# Patient Record
Sex: Female | Born: 1985 | Race: White | Marital: Married | Smoking: Never smoker
Health system: Southern US, Community
[De-identification: ages and names within clinical notes are randomized; demographics above are authoritative.]

## PROBLEM LIST (undated history)

## (undated) DIAGNOSIS — J020 Streptococcal pharyngitis: Secondary | ICD-10-CM

## (undated) DIAGNOSIS — R51 Headache: Secondary | ICD-10-CM

## (undated) DIAGNOSIS — J40 Bronchitis, not specified as acute or chronic: Secondary | ICD-10-CM

## (undated) DIAGNOSIS — K219 Gastro-esophageal reflux disease without esophagitis: Secondary | ICD-10-CM

## (undated) DIAGNOSIS — Z789 Other specified health status: Secondary | ICD-10-CM

## (undated) HISTORY — PX: NO PAST SURGERIES: SHX2092

## (undated) HISTORY — DX: Headache: R51

## (undated) HISTORY — DX: Gastro-esophageal reflux disease without esophagitis: K21.9

---

## 2010-10-13 LAB — OB RESULTS CONSOLE HIV ANTIBODY (ROUTINE TESTING): HIV: NONREACTIVE

## 2010-10-13 LAB — OB RESULTS CONSOLE RPR: RPR: NONREACTIVE

## 2010-10-13 LAB — OB RESULTS CONSOLE ABO/RH: RH Type: POSITIVE

## 2010-10-30 ENCOUNTER — Other Ambulatory Visit (HOSPITAL_COMMUNITY)
Admission: RE | Admit: 2010-10-30 | Discharge: 2010-10-30 | Disposition: A | Discharge status: Home / Self Care | Payer: 59 | Source: Ambulatory Visit | Attending: Obstetrics and Gynecology | Admitting: Obstetrics and Gynecology

## 2010-10-30 DIAGNOSIS — Z113 Encounter for screening for infections with a predominantly sexual mode of transmission: Secondary | ICD-10-CM | POA: Insufficient documentation

## 2010-10-30 DIAGNOSIS — Z01419 Encounter for gynecological examination (general) (routine) without abnormal findings: Secondary | ICD-10-CM | POA: Insufficient documentation

## 2010-10-30 LAB — OB RESULTS CONSOLE GC/CHLAMYDIA
Chlamydia: NEGATIVE
Gonorrhea: NEGATIVE

## 2010-12-20 ENCOUNTER — Inpatient Hospital Stay (HOSPITAL_COMMUNITY): Payer: 59

## 2010-12-20 ENCOUNTER — Encounter (HOSPITAL_COMMUNITY): Payer: Self-pay

## 2010-12-20 ENCOUNTER — Emergency Department (HOSPITAL_COMMUNITY)
Admission: AD | Admit: 2010-12-20 | Discharge: 2010-12-20 | Disposition: A | Discharge status: Home / Self Care | Payer: 59 | Source: Ambulatory Visit | Attending: Emergency Medicine | Admitting: Emergency Medicine

## 2010-12-20 DIAGNOSIS — O2690 Pregnancy related conditions, unspecified, unspecified trimester: Secondary | ICD-10-CM | POA: Insufficient documentation

## 2010-12-20 DIAGNOSIS — R0602 Shortness of breath: Secondary | ICD-10-CM

## 2010-12-20 DIAGNOSIS — Z3400 Encounter for supervision of normal first pregnancy, unspecified trimester: Secondary | ICD-10-CM

## 2010-12-20 DIAGNOSIS — R05 Cough: Secondary | ICD-10-CM

## 2010-12-20 DIAGNOSIS — R059 Cough, unspecified: Secondary | ICD-10-CM | POA: Insufficient documentation

## 2010-12-20 DIAGNOSIS — Z79899 Other long term (current) drug therapy: Secondary | ICD-10-CM | POA: Insufficient documentation

## 2010-12-20 HISTORY — DX: Other specified health status: Z78.9

## 2010-12-20 LAB — CBC
HCT: 29.9 % — ABNORMAL LOW (ref 36.0–46.0)
Hemoglobin: 10.4 g/dL — ABNORMAL LOW (ref 12.0–15.0)
MCHC: 34.8 g/dL (ref 30.0–36.0)
MCV: 86.7 fL (ref 78.0–100.0)
RDW: 12.9 % (ref 11.5–15.5)

## 2010-12-20 LAB — DIFFERENTIAL
Basophils Absolute: 0 10*3/uL (ref 0.0–0.1)
Basophils Relative: 0 % (ref 0–1)
Eosinophils Relative: 4 % (ref 0–5)
Lymphocytes Relative: 26 % (ref 12–46)
Monocytes Absolute: 0.4 10*3/uL (ref 0.1–1.0)
Monocytes Relative: 6 % (ref 3–12)

## 2010-12-20 LAB — COMPREHENSIVE METABOLIC PANEL
AST: 15 U/L (ref 0–37)
BUN: 4 mg/dL — ABNORMAL LOW (ref 6–23)
CO2: 22 mEq/L (ref 19–32)
Calcium: 8.9 mg/dL (ref 8.4–10.5)
Creatinine, Ser: 0.52 mg/dL (ref 0.50–1.10)
GFR calc non Af Amer: >90 mL/min (ref >90)
Total Bilirubin: 0.3 mg/dL (ref 0.3–1.2)

## 2010-12-20 MED ORDER — ALBUTEROL SULFATE (5 MG/ML) 0.5% IN NEBU
2.5000 mg | INHALATION_SOLUTION | RESPIRATORY_TRACT | Status: DC
  Administered 2010-12-20: 2.5 mg via RESPIRATORY_TRACT

## 2010-12-20 MED ORDER — AMOXICILLIN 500 MG PO CAPS: 500.0000 mg | ORAL_CAPSULE | Freq: Three times a day (TID) | ORAL | Status: AC

## 2010-12-20 MED ORDER — ALBUTEROL SULFATE (5 MG/ML) 0.5% IN NEBU
INHALATION_SOLUTION | RESPIRATORY_TRACT | Status: AC
  Administered 2010-12-20: 2.5 mg via RESPIRATORY_TRACT
  Filled 2010-12-20: qty 0.5

## 2010-12-20 MED ORDER — ALBUTEROL SULFATE HFA 108 (90 BASE) MCG/ACT IN AERS
2.0000 | INHALATION_SPRAY | RESPIRATORY_TRACT | Status: AC
  Filled 2010-12-20: qty 6.7

## 2010-12-20 MED ORDER — ALBUTEROL SULFATE (5 MG/ML) 0.5% IN NEBU
2.5000 mg | INHALATION_SOLUTION | RESPIRATORY_TRACT | Status: DC
  Administered 2010-12-20: 2.5 mg via RESPIRATORY_TRACT
  Filled 2010-12-20: qty 0.5

## 2010-12-20 MED ORDER — IPRATROPIUM BROMIDE 0.02 % IN SOLN
0.5000 mg | Freq: Once | RESPIRATORY_TRACT | Status: AC
  Administered 2010-12-20: 0.5 mg via RESPIRATORY_TRACT
  Filled 2010-12-20: qty 2.5

## 2010-12-20 MED ORDER — PREDNISONE 10 MG PO TABS: 20.0000 mg | ORAL_TABLET | Freq: Every day | ORAL | Status: AC

## 2010-12-20 NOTE — ED Provider Notes (Signed)
History     CSN: [id number] Arrival date & time: 12/20/2010  2:31 PM   First MD Initiated Contact with Patient 12/20/10 2114      Chief Complaint  Patient presents with  . Shortness of Breath    (Consider location/radiation/quality/duration/timing/severity/associated sxs/prior treatment) HPI Comments: Patient is pregnant, was seen at [facility] for cough for 3 weeks.  She was sent here for eval of possible pe.  Patient tells me that she feels much better after 2 nebs given there.  She denies calf or leg pain.  No fevers.  No chest pain.    Patient is a 25 y.o. female presenting with shortness of breath. The history is provided by the patient.  Shortness of Breath  The current episode started more than 2 weeks ago. The problem occurs continuously. The problem has been gradually worsening. The problem is moderate. The symptoms are relieved by beta-agonist inhalers. The symptoms are aggravated by nothing. Associated symptoms include cough and shortness of breath. Pertinent negatives include no chest pain, no chest pressure and no fever.    Past Medical History  Diagnosis Date  . No pertinent past medical history     Past Surgical History  Procedure Date  . No past surgeries     No family history on file.  History  Substance Use Topics  . Smoking status: Not on file  . Smokeless tobacco: Not on file  . Alcohol Use: Not on file    OB History    Grav Para Term Preterm Abortions TAB SAB Ect Mult Living   1               Review of Systems  Constitutional: Negative for fever.  Respiratory: Positive for cough and shortness of breath.   Cardiovascular: Negative for chest pain.  All other systems reviewed and are negative.    Allergies  Imitrex and Maxalt  Home Medications   Current Outpatient Rx  Name Route Sig Dispense Refill  . ACETAMINOPHEN 500 MG PO TABS Oral Take 1,000 mg by mouth every 6 (six) hours as needed. Patient is using this medication for pain.      . ALBUTEROL SULFATE HFA 108 (90 BASE) MCG/ACT IN AERS Inhalation Inhale 2 puffs into the lungs every 6 (six) hours as needed.      . DELSYM PO Oral Take 5 mLs by mouth daily as needed. Patient is using this medication for cough.     Marland Kitchen DIPHENHYDRAMINE HCL 25 MG PO TABS Oral Take 50 mg by mouth every 6 (six) hours as needed. Patient used this medication for itching.     Marland Kitchen HYDROCODONE-HOMATROPINE 5-1.5 MG/5ML PO SYRP Oral Take 5 mLs by mouth every 6 (six) hours as needed.      Marland Kitchen LORATADINE 10 MG PO TABS Oral Take 10 mg by mouth daily. Patient used this medication for allergies.     . ONDANSETRON 8 MG PO TBDP Oral Take 8 mg by mouth every 8 (eight) hours as needed.        BP 114/65  Pulse 92  Temp(Src) 98.4 F (36.9 C) (Oral)  Resp 16  Ht 5\' 7"  (1.702 m)  Wt 171 lb 3.2 oz (77.656 kg)  BMI 26.81 kg/m2  SpO2 97%  Physical Exam  Nursing note and vitals reviewed. Constitutional: She is oriented to person, place, and time. She appears well-developed and well-nourished. No distress.  HENT:  Head: Normocephalic and atraumatic.  Neck: Normal range of motion. Neck supple.  Cardiovascular: Normal  rate and regular rhythm.  Exam reveals no gallop and no friction rub.   No murmur heard. Pulmonary/Chest: Effort normal and breath sounds normal. No respiratory distress. She has no wheezes.  Abdominal: Soft. Bowel sounds are normal. She exhibits no distension. There is no tenderness.  Musculoskeletal: Normal range of motion.       No calf pain.  Homan's absent.  Neurological: She is alert and oriented to person, place, and time.  Skin: Skin is warm and dry. She is not diaphoretic.    ED Course  Procedures (including critical care time)  Labs Reviewed  CBC - Abnormal; Notable for the following:    RBC 3.45 (*)    Hemoglobin 10.4 (*)    HCT 29.9 (*)    All other components within normal limits  COMPREHENSIVE METABOLIC PANEL - Abnormal; Notable for the following:    BUN 4 (*)    Albumin 2.9  (*)    All other components within normal limits  DIFFERENTIAL   Dg Chest 2 View  12/20/2010  *RADIOLOGY REPORT*  Clinical Data: Hypoxemia with difficulty breathing and cough for 3 weeks.  [redacted] weeks pregnant.  CHEST - 2 VIEW  Comparison: None.  Findings: The heart size and mediastinal contours are normal.  The pulmonary vascularity is normal for gravid state.  The lungs are clear.  There is no pleural effusion or pneumothorax.  Osseous structures appear normal.  IMPRESSION: No active cardiopulmonary process.  Original Report Authenticated By: Gerrianne Scale, M.D.     1. Shortness of breath   2. Pregnancy, first normal   3. Cough       MDM  I evaluated the patient.  Her vital signs were stable.  There was no tachycardia, tachypnea, or hypoxia.  She does have a bronchitic cough.  I believe this to be bronchitis.  This seems to be bronchitis.  I highly doubt pe.  I will treat with antibiotics, prednisone, and inhalers.          Geoffery Lyons, MD 12/20/10 2144

## 2010-12-20 NOTE — Progress Notes (Signed)
Patient states she was treated for strep throat about 3 weeks ago, took all medication. Started having a cough about the same time. Cough is getting worse and at times feels unable to catch her breath when coughing. Having tightness in the chest that is worse with deep breathing.

## 2010-12-20 NOTE — ED Notes (Signed)
[facility] [redacted] weeks pregnant with c/o several weeks of cough. Pt states the cough has been progressively worsening, making it difficult to sleep at night. Pt states her chest hurts from coughing. Pt was given 2 breathing tx at [facility] with relief. Lungs clear.

## 2010-12-20 NOTE — Progress Notes (Signed)
Pt peak flow post Albuterol/Atrovent tx was 160. Pt's BBS are still very diminished with Wheezes throught. Pt is having a hard time speaking in full sentences due to SOB and Wheezing and has been this way for several days, per Pt's statement

## 2010-12-20 NOTE — Progress Notes (Signed)
Called to pt's room in MAU 9 to give 2.5 mg Albuterol/0.5 Atrovent HHN. Pre peak flow was 170, peak flow done per Dr request. Pt's BBS are very diminished with wheezes throught

## 2010-12-20 NOTE — ED Provider Notes (Signed)
History     Chief Complaint  Patient presents with  . Shortness of Breath   HPI  25 y/o G1 at 26 5/7 weeks presents with c/o SOB.  Pt was treated for Strep throat 3 weeks ago.  She states she felt better but she developed a chronic cough.  It was not relieved by Robitussin, Claritin and other OTC remedies.  She went back to urgent care and was told she just had a cough and was given a Codeine syrup, which did not help either. Pt reports cough is worsened when she lies flat, so she has been sleeping in a recliner or on several pillows.  On arrival today, she felt anxious because she could not get a breath in and it felt like someone was sitting on her chest.  Per NP, she was having difficulty completing her sentences.  Pt denies fevers/chills, diarrhea.  Pt has had vomiting but only after dry heaving after continuous coughing.  Pt denies lower extremity edema, palpitations, dizziness.  Pt denies contractions, vaginal bleeding.  She has not felt fetal movement yet.  Pt states she feels much better after 2 neb treatments.  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 No pertinent past medical history     Past Surgical History  Procedure Date  . No past surgeries     No family history on file.  History  Substance Use Topics  . Smoking status: Not on file  . Smokeless tobacco: Not on file  . Alcohol Use: Not on file    Allergies:  Allergies  Allergen Reactions  . Imitrex (Sumatriptan Base) Anaphylaxis  . Maxalt (Rizatriptan Benzoate) Anaphylaxis    Prescriptions prior to admission  Medication Sig Dispense Refill  . acetaminophen (TYLENOL) 500 MG tablet Take 1,000 mg by mouth every 6 (six) hours as needed. Patient is using this medication for pain.       Marland Kitchen albuterol (PROVENTIL HFA;VENTOLIN HFA) 108 (90 BASE) MCG/ACT inhaler Inhale 2 puffs into the lungs every 6 (six) hours as needed.        Marland Kitchen  Dextromethorphan Polistirex (DELSYM PO) Take 5 mLs by mouth daily as needed. Patient is using this medication for cough.       . diphenhydrAMINE (BENADRYL) 25 MG tablet Take 50 mg by mouth every 6 (six) hours as needed. Patient used this medication for itching.       Marland Kitchen HYDROcodone-homatropine (HYCODAN) 5-1.5 MG/5ML syrup Take 5 mLs by mouth every 6 (six) hours as needed.        . loratadine (CLARITIN) 10 MG tablet Take 10 mg by mouth daily. Patient used this medication for allergies.       Marland Kitchen ondansetron (ZOFRAN-ODT) 8 MG disintegrating tablet Take 8 mg by mouth every 8 (eight) hours as needed.        Marland Kitchen DISCONTD: dextromethorphan 15 MG/5ML syrup Take 5 mLs by mouth 4 (four) times daily as needed. Patient used this medication for a cold.         Review of Systems  Constitutional: Negative for fever and chills.       Decreased appetite.  HENT: Negative for congestion and sore throat.   Respiratory: Positive for cough and shortness of breath. Negative for wheezing.   Cardiovascular: Positive for chest pain and orthopnea. Negative for palpitations and leg swelling.  Gastrointestinal: Positive for vomiting. Negative for heartburn, nausea and  diarrhea.  Neurological: Negative for dizziness.   Physical Exam   Blood pressure 100/67, pulse 96, temperature 97.7 F (36.5 C), temperature source Oral, resp. rate 20, height 5\' 7"  (1.702 m), weight 77.656 kg (171 lb 3.2 oz), SpO2 99.00%. FHTs 140s  Physical Exam  Constitutional: She is oriented to person, place, and time. She appears well-developed and well-nourished. No distress.  HENT:  Head: Normocephalic and atraumatic.  Eyes: Conjunctivae and EOM are normal.  Neck: Normal range of motion.  Cardiovascular: Normal rate, regular rhythm and normal heart sounds.   No murmur heard. Respiratory: Effort normal. No respiratory distress. She has no wheezes. She has no rales.       Slightly decreased in bilateral bases.  Musculoskeletal: She exhibits no  edema and no tenderness.  Neurological: She is alert and oriented to person, place, and time.   Gen:  Sitting upright, Nasal cannula in place. HEENT:  Slight retraction with breathing. CV:  RRR Lungs with good air movement in upper lobes, less so at the bases.  No crackles, wheezes or rhonchi appreciated. Abdomen:  Soft, palpable uterus, NT No epigastric pain illicited. Extremities:  No edema, no calf tenderness.  Color normal.  MAU Course  Procedures    Assessment and Plan  Pregnancy at 16 5/7 weeks Acute SOB after Chronic cough- improved after 2 neb treatments. S/p Strep throat 3 weeks ago. Decreased O2 saturation. Unclear of etiology of SOB-Consider Bronchitis, Pulmonary Embolism.  Unlikely cardiomyopathy due to lack of LE edema, normal chest xray, but consider cardiac echo if clinically indicated. Spoke with Dr. Judd Lien, [facility], and he has accepted transfer for further evaluation.  CBC, CMP pending at the time of this note. I recommend proceeding with Spiral CT, if clinically indicated, due to low fetal risk.  Pt counseled on this. If discharged from the ER, pt already has an appointment to see me next week.   [name] 12/20/2010, 6:32 PM

## 2010-12-20 NOTE — Progress Notes (Signed)
Strep throat 3 wks  Ago. Treated with amoxicillin finished, sore throat ended and started a cough.pt states that she has a dry cough.

## 2010-12-20 NOTE — Progress Notes (Signed)
Dr. Ocie Bob on the unit to evaluate pt

## 2010-12-20 NOTE — Progress Notes (Signed)
[facility] as well [facility] called and informed of Dr. Ginnie Smart plan to transfer pt . Report given to both.

## 2010-12-20 NOTE — ED Provider Notes (Signed)
History     Chief Complaint  Patient presents with  . Shortness of Breath   HPI Ashley Mcguire 25 y.o. 16 w gestation.  Has had cough for 3 weeks.  Began after being treated for strep.  Unable to lie down to sleep at night.  Only sleeping for an hour or two at a time.  Having repetitive, nonproductive cough and difficulty breathing.  Took one nebulizer treatment at work a few days ago.  Denies having asthma and does not use an inhaler.     OB History    Grav Para Term Preterm Abortions TAB SAB Ect Mult Living   1               Past Medical History  Diagnosis Date  . No pertinent past medical history     Past Surgical History  Procedure Date  . No past surgeries     No family history on file.  History  Substance Use Topics  . Smoking status: Not on file  . Smokeless tobacco: Not on file  . Alcohol Use: Not on file    Allergies:  Allergies  Allergen Reactions  . Imitrex (Sumatriptan Base) Anaphylaxis  . Maxalt (Rizatriptan Benzoate) Anaphylaxis    Prescriptions prior to admission  Medication Sig Dispense Refill  . acetaminophen (TYLENOL) 500 MG tablet Take 1,000 mg by mouth every 6 (six) hours as needed. Patient is using this medication for pain.       Marland Kitchen albuterol (PROVENTIL HFA;VENTOLIN HFA) 108 (90 BASE) MCG/ACT inhaler Inhale 2 puffs into the lungs every 6 (six) hours as needed.        Marland Kitchen Dextromethorphan Polistirex (DELSYM PO) Take 5 mLs by mouth daily as needed. Patient is using this medication for cough.       . diphenhydrAMINE (BENADRYL) 25 MG tablet Take 50 mg by mouth every 6 (six) hours as needed. Patient used this medication for itching.       Marland Kitchen HYDROcodone-homatropine (HYCODAN) 5-1.5 MG/5ML syrup Take 5 mLs by mouth every 6 (six) hours as needed.        . loratadine (CLARITIN) 10 MG tablet Take 10 mg by mouth daily. Patient used this medication for allergies.       Marland Kitchen ondansetron (ZOFRAN-ODT) 8 MG disintegrating tablet Take 8 mg by mouth every 8 (eight) hours  as needed.        Marland Kitchen DISCONTD: dextromethorphan 15 MG/5ML syrup Take 5 mLs by mouth 4 (four) times daily as needed. Patient used this medication for a cold.         Review of Systems  Respiratory: Positive for cough.        Difficulty breathing   Physical Exam   Blood pressure 117/74, pulse 100, temperature 98.5 F (36.9 C), temperature source Oral, resp. rate 24, height 5\' 7"  (1.702 m), weight 171 lb 3.2 oz (77.656 kg), SpO2 94.00%.  Physical Exam  Nursing note and vitals reviewed. Constitutional: She is oriented to person, place, and time. She appears well-developed and well-nourished.  HENT:  Head: Normocephalic.  Eyes: EOM are normal.  Neck: Neck supple.  Cardiovascular: Normal rate.   Respiratory: She has wheezes.       Wheezes on right, repetitive,nonproductive cough, O2 sat 94%, peak flow 170  Musculoskeletal: Normal range of motion.  Neurological: She is alert and oriented to person, place, and time.  Skin: Skin is warm and dry.  Psychiatric: She has a normal mood and affect.    MAU Course  Procedures Respiratory therapy gave nebulizer treatment - peak flow 160 after treatment - still coughing, about 15 minutes after treatment, O2 sat 91-93% while breathing normally, with conscious deep breaths, O2 sat 94-96%, O2 at 5L via nasal cannula started. Dr. Dion Body notified of condition and treatments given so far.  Chest X ray ordered.  MDM Continues to have wheezing and coughing.  Second nebulizer treatment ordered.   Care assumed by Dr. Dion Body.     Ashley Mcguire 12/20/2010, 4:10 PM   Ashley Bernheim, NP 12/20/10 1755

## 2010-12-29 ENCOUNTER — Ambulatory Visit (HOSPITAL_COMMUNITY): Payer: 59 | Attending: Obstetrics and Gynecology

## 2010-12-29 ENCOUNTER — Other Ambulatory Visit: Payer: Self-pay | Admitting: Obstetrics and Gynecology

## 2010-12-29 DIAGNOSIS — Z3689 Encounter for other specified antenatal screening: Secondary | ICD-10-CM

## 2011-01-23 NOTE — L&D Delivery Note (Signed)
Delivery Note At 5:50 PM a viable female was delivered via Vaginal, Spontaneous Delivery (Presentation: Right Occiput Anterior).  APGAR: 9, 9; weight .   Placenta status: Intact, Spontaneous.  Cord: 3 vessels with the following complications: None.  Cord pH: n/a  Anesthesia: Epidural Local  Episiotomy: None Lacerations: 1st degree;Vaginal, left Suture Repair: 2.0 3.0 chromic Est. Blood Loss (mL): 350 10 cc 1% Lidocaine used I/O of bladder 225 ml Clot removed from cervical os manually  Mom to postpartum.  Baby to skin to skin.  [name] 06/11/2011, 6:19 PM

## 2011-05-19 LAB — OB RESULTS CONSOLE GBS: GBS: NEGATIVE

## 2011-06-01 ENCOUNTER — Inpatient Hospital Stay (HOSPITAL_COMMUNITY): Admission: AD | Admit: 2011-06-01 | Payer: Self-pay | Source: Ambulatory Visit | Admitting: Obstetrics and Gynecology

## 2011-06-05 ENCOUNTER — Telehealth (HOSPITAL_COMMUNITY): Payer: Self-pay | Admitting: *Deleted

## 2011-06-05 ENCOUNTER — Encounter (HOSPITAL_COMMUNITY): Payer: Self-pay | Admitting: *Deleted

## 2011-06-05 NOTE — Telephone Encounter (Signed)
Preadmission screen  

## 2011-06-08 ENCOUNTER — Other Ambulatory Visit: Payer: Self-pay | Admitting: Obstetrics and Gynecology

## 2011-06-10 ENCOUNTER — Encounter (HOSPITAL_COMMUNITY): Payer: Self-pay

## 2011-06-10 ENCOUNTER — Inpatient Hospital Stay (HOSPITAL_COMMUNITY)
Admission: RE | Admit: 2011-06-10 | Discharge: 2011-06-13 | DRG: 775 | Disposition: A | Discharge status: Home / Self Care | Payer: 59 | Source: Ambulatory Visit | Attending: Obstetrics and Gynecology | Admitting: Obstetrics and Gynecology

## 2011-06-10 DIAGNOSIS — O480 Post-term pregnancy: Principal | ICD-10-CM | POA: Diagnosis present

## 2011-06-10 HISTORY — DX: Bronchitis, not specified as acute or chronic: J40

## 2011-06-10 HISTORY — DX: Streptococcal pharyngitis: J02.0

## 2011-06-10 LAB — CBC
HCT: 32.8 % — ABNORMAL LOW (ref 36.0–46.0)
Hemoglobin: 11.1 g/dL — ABNORMAL LOW (ref 12.0–15.0)
MCH: 30.3 pg (ref 26.0–34.0)
MCHC: 33.8 g/dL (ref 30.0–36.0)
MCV: 89.6 fL (ref 78.0–100.0)

## 2011-06-10 MED ORDER — OXYTOCIN BOLUS FROM INFUSION
500.0000 mL | Freq: Once | INTRAVENOUS | Status: DC
  Filled 2011-06-10: qty 500

## 2011-06-10 MED ORDER — ZOLPIDEM TARTRATE 10 MG PO TABS
10.0000 mg | ORAL_TABLET | Freq: Every evening | ORAL | Status: DC | PRN
  Administered 2011-06-10: 10 mg via ORAL
  Filled 2011-06-10: qty 1

## 2011-06-10 MED ORDER — ONDANSETRON HCL 4 MG/2ML IJ SOLN
4.0000 mg | Freq: Four times a day (QID) | INTRAMUSCULAR | Status: DC | PRN
  Administered 2011-06-11: 4 mg via INTRAVENOUS
  Filled 2011-06-10: qty 2

## 2011-06-10 MED ORDER — LIDOCAINE HCL (PF) 1 % IJ SOLN
30.0000 mL | INTRAMUSCULAR | Status: DC | PRN
  Administered 2011-06-11: 30 mL via SUBCUTANEOUS
  Filled 2011-06-10 (×2): qty 30

## 2011-06-10 MED ORDER — OXYTOCIN 20 UNITS IN LACTATED RINGERS INFUSION - SIMPLE: 125.0000 mL/h | Freq: Once | INTRAVENOUS | Status: DC

## 2011-06-10 MED ORDER — ACETAMINOPHEN 325 MG PO TABS: 650.0000 mg | ORAL_TABLET | ORAL | Status: DC | PRN

## 2011-06-10 MED ORDER — OXYTOCIN 20 UNITS IN LACTATED RINGERS INFUSION - SIMPLE: 1.0000 m[IU]/min | INTRAVENOUS | Status: DC

## 2011-06-10 MED ORDER — OXYCODONE-ACETAMINOPHEN 5-325 MG PO TABS: 1.0000 | ORAL_TABLET | ORAL | Status: DC | PRN

## 2011-06-10 MED ORDER — TERBUTALINE SULFATE 1 MG/ML IJ SOLN: 0.2500 mg | Freq: Once | INTRAMUSCULAR | Status: AC | PRN

## 2011-06-10 MED ORDER — MISOPROSTOL 25 MCG QUARTER TABLET
25.0000 ug | ORAL_TABLET | ORAL | Status: DC | PRN
  Administered 2011-06-10: 25 ug via VAGINAL
  Filled 2011-06-10: qty 0.25

## 2011-06-10 MED ORDER — CITRIC ACID-SODIUM CITRATE 334-500 MG/5ML PO SOLN: 30.0000 mL | ORAL | Status: DC | PRN

## 2011-06-10 MED ORDER — IBUPROFEN 600 MG PO TABS
600.0000 mg | ORAL_TABLET | Freq: Four times a day (QID) | ORAL | Status: DC | PRN
  Filled 2011-06-10: qty 1

## 2011-06-10 MED ORDER — BUTORPHANOL TARTRATE 2 MG/ML IJ SOLN
1.0000 mg | INTRAMUSCULAR | Status: DC | PRN
  Administered 2011-06-11 (×2): 1 mg via INTRAVENOUS
  Filled 2011-06-10 (×2): qty 1

## 2011-06-10 MED ORDER — LACTATED RINGERS IV SOLN
INTRAVENOUS | Status: DC
  Administered 2011-06-10 – 2011-06-11 (×2): via INTRAVENOUS

## 2011-06-10 MED ORDER — FLEET ENEMA 7-19 GM/118ML RE ENEM: 1.0000 | ENEMA | RECTAL | Status: DC | PRN

## 2011-06-10 MED ORDER — LACTATED RINGERS IV SOLN
500.0000 mL | INTRAVENOUS | Status: DC | PRN
  Administered 2011-06-11: 1000 mL via INTRAVENOUS

## 2011-06-10 NOTE — Progress Notes (Signed)
Received report from Davina Poke RN

## 2011-06-11 ENCOUNTER — Encounter (HOSPITAL_COMMUNITY): Payer: Self-pay | Admitting: Anesthesiology

## 2011-06-11 ENCOUNTER — Inpatient Hospital Stay (HOSPITAL_COMMUNITY): Payer: 59 | Admitting: Anesthesiology

## 2011-06-11 ENCOUNTER — Encounter (HOSPITAL_COMMUNITY): Payer: Self-pay

## 2011-06-11 MED ORDER — DIPHENHYDRAMINE HCL 25 MG PO CAPS: 25.0000 mg | ORAL_CAPSULE | Freq: Four times a day (QID) | ORAL | Status: DC | PRN

## 2011-06-11 MED ORDER — MEASLES, MUMPS & RUBELLA VAC ~~LOC~~ INJ: 0.5000 mL | INJECTION | Freq: Once | SUBCUTANEOUS | Status: DC

## 2011-06-11 MED ORDER — DIPHENHYDRAMINE HCL 50 MG/ML IJ SOLN: 12.5000 mg | INTRAMUSCULAR | Status: DC | PRN

## 2011-06-11 MED ORDER — PRENATAL MULTIVITAMIN CH
1.0000 | ORAL_TABLET | Freq: Every day | ORAL | Status: DC
  Administered 2011-06-12: 1 via ORAL
  Filled 2011-06-11: qty 1

## 2011-06-11 MED ORDER — ONDANSETRON HCL 4 MG/2ML IJ SOLN: 4.0000 mg | INTRAMUSCULAR | Status: DC | PRN

## 2011-06-11 MED ORDER — ALBUTEROL SULFATE HFA 108 (90 BASE) MCG/ACT IN AERS
2.0000 | INHALATION_SPRAY | Freq: Four times a day (QID) | RESPIRATORY_TRACT | Status: DC | PRN
  Filled 2011-06-11: qty 6.7

## 2011-06-11 MED ORDER — IBUPROFEN 600 MG PO TABS
600.0000 mg | ORAL_TABLET | Freq: Four times a day (QID) | ORAL | Status: DC
  Administered 2011-06-11 – 2011-06-13 (×7): 600 mg via ORAL
  Filled 2011-06-11 (×6): qty 1

## 2011-06-11 MED ORDER — SENNOSIDES-DOCUSATE SODIUM 8.6-50 MG PO TABS
2.0000 | ORAL_TABLET | Freq: Every day | ORAL | Status: DC
  Administered 2011-06-11 – 2011-06-12 (×2): 2 via ORAL

## 2011-06-11 MED ORDER — LANOLIN HYDROUS EX OINT: TOPICAL_OINTMENT | CUTANEOUS | Status: DC | PRN

## 2011-06-11 MED ORDER — FERROUS SULFATE 325 (65 FE) MG PO TABS
325.0000 mg | ORAL_TABLET | Freq: Two times a day (BID) | ORAL | Status: DC
  Administered 2011-06-12 (×2): 325 mg via ORAL
  Filled 2011-06-11 (×2): qty 1

## 2011-06-11 MED ORDER — TETANUS-DIPHTH-ACELL PERTUSSIS 5-2.5-18.5 LF-MCG/0.5 IM SUSP: 0.5000 mL | Freq: Once | INTRAMUSCULAR | Status: DC

## 2011-06-11 MED ORDER — OXYCODONE-ACETAMINOPHEN 5-325 MG PO TABS: 1.0000 | ORAL_TABLET | ORAL | Status: DC | PRN

## 2011-06-11 MED ORDER — EPHEDRINE 5 MG/ML INJ
10.0000 mg | INTRAVENOUS | Status: DC | PRN
  Filled 2011-06-11: qty 4

## 2011-06-11 MED ORDER — METHYLERGONOVINE MALEATE 0.2 MG/ML IJ SOLN: 0.2000 mg | INTRAMUSCULAR | Status: DC | PRN

## 2011-06-11 MED ORDER — EPHEDRINE 5 MG/ML INJ: 10.0000 mg | INTRAVENOUS | Status: DC | PRN

## 2011-06-11 MED ORDER — LACTATED RINGERS IV SOLN: 500.0000 mL | Freq: Once | INTRAVENOUS | Status: DC

## 2011-06-11 MED ORDER — WITCH HAZEL-GLYCERIN EX PADS: 1.0000 "application " | MEDICATED_PAD | CUTANEOUS | Status: DC | PRN

## 2011-06-11 MED ORDER — LIDOCAINE HCL (PF) 1 % IJ SOLN
INTRAMUSCULAR | Status: DC | PRN
  Administered 2011-06-11 (×3): 4 mL

## 2011-06-11 MED ORDER — PHENYLEPHRINE 40 MCG/ML (10ML) SYRINGE FOR IV PUSH (FOR BLOOD PRESSURE SUPPORT)
80.0000 ug | PREFILLED_SYRINGE | INTRAVENOUS | Status: DC | PRN
  Filled 2011-06-11: qty 5

## 2011-06-11 MED ORDER — NALOXONE HCL 0.4 MG/ML IJ SOLN
INTRAMUSCULAR | Status: AC
  Filled 2011-06-11: qty 1

## 2011-06-11 MED ORDER — SIMETHICONE 80 MG PO CHEW: 80.0000 mg | CHEWABLE_TABLET | ORAL | Status: DC | PRN

## 2011-06-11 MED ORDER — MAGNESIUM HYDROXIDE 400 MG/5ML PO SUSP: 30.0000 mL | ORAL | Status: DC | PRN

## 2011-06-11 MED ORDER — BENZOCAINE-MENTHOL 20-0.5 % EX AERO: 1.0000 "application " | INHALATION_SPRAY | CUTANEOUS | Status: DC | PRN

## 2011-06-11 MED ORDER — METHYLERGONOVINE MALEATE 0.2 MG PO TABS: 0.2000 mg | ORAL_TABLET | ORAL | Status: DC | PRN

## 2011-06-11 MED ORDER — ONDANSETRON HCL 4 MG PO TABS: 4.0000 mg | ORAL_TABLET | ORAL | Status: DC | PRN

## 2011-06-11 MED ORDER — OXYTOCIN 20 UNITS IN LACTATED RINGERS INFUSION - SIMPLE
1.0000 m[IU]/min | INTRAVENOUS | Status: DC
  Administered 2011-06-11: 333 m[IU]/min via INTRAVENOUS
  Administered 2011-06-11: 1 m[IU]/min via INTRAVENOUS
  Filled 2011-06-11: qty 1000

## 2011-06-11 MED ORDER — ZOLPIDEM TARTRATE 5 MG PO TABS: 5.0000 mg | ORAL_TABLET | Freq: Every evening | ORAL | Status: DC | PRN

## 2011-06-11 MED ORDER — DIBUCAINE 1 % RE OINT: 1.0000 "application " | TOPICAL_OINTMENT | RECTAL | Status: DC | PRN

## 2011-06-11 MED ORDER — PHENYLEPHRINE 40 MCG/ML (10ML) SYRINGE FOR IV PUSH (FOR BLOOD PRESSURE SUPPORT): 80.0000 ug | PREFILLED_SYRINGE | INTRAVENOUS | Status: DC | PRN

## 2011-06-11 MED ORDER — FENTANYL 2.5 MCG/ML BUPIVACAINE 1/10 % EPIDURAL INFUSION (WH - ANES)
14.0000 mL/h | INTRAMUSCULAR | Status: DC
  Administered 2011-06-11: 14 mL/h via EPIDURAL
  Filled 2011-06-11: qty 60

## 2011-06-11 MED ORDER — OXYTOCIN 20 UNITS IN LACTATED RINGERS INFUSION - SIMPLE: 125.0000 mL/h | INTRAVENOUS | Status: DC | PRN

## 2011-06-11 NOTE — H&P (Addendum)
Ashley Mcguire is a 26 y.o. female G1 @ 73 4/7 weeks presenting for postdates induction.  Pt is currently without complaints. She was given one Cytotec last night and has been contracting regularly since, every 2-4 minutes.  Pt says she has discomfort in lower back but mild.  Denies LOF, fetus is active.  Maternal Medical History:  Contractions: Onset was 6-12 hours ago.   Frequency: regular.   Duration is approximately 2 minutes.   Perceived severity is mild.    Fetal activity: Perceived fetal activity is normal.    Prenatal complications: no prenatal complications Prenatal Complications - Diabetes: none.    OB History    Grav Para Term Preterm Abortions TAB SAB Ect Mult Living   1              Past Medical History  Diagnosis Date  . No pertinent past medical history   . Headache   . GERD (gastroesophageal reflux disease)   . Bronchitis     during pregnancy  . Strep throat     during pregnancy   Past Surgical History  Procedure Date  . No past surgeries    Family History: family history is not on file. Social History:  reports that she has never smoked. She does not have any smokeless tobacco history on file. She reports that she does not drink alcohol or use illicit drugs.  Review of Systems  Constitutional: Negative.   Respiratory: Negative.   Cardiovascular: Negative.   Psychiatric/Behavioral: Negative.     Dilation: Fingertip Effacement (%): Thick Station: -3 Exam by:: Dr. Idamae Schuller Blood pressure 106/64, pulse 78, temperature 97.9 F (36.6 C), temperature source Oral, resp. rate 16, height 5\' 8"  (1.727 m), weight 92.534 kg (204 lb). Maternal Exam:  Uterine Assessment: Contraction duration is 2 minutes. Contraction frequency is regular.   Abdomen: Estimated fetal weight is 8+ pounds.   Fetal presentation: vertex  Introitus: Normal vulva. Normal vagina.  Pelvis: adequate for delivery.   Cervix: Cervix evaluated by sterile speculum exam and digital exam.      Fetal Exam Fetal Monitor Review: Baseline rate: 130s, Reactive.  Variability: moderate (6-25 bpm).   Pattern: accelerations present.    Fetal State Assessment: Category I - tracings are normal.     Physical Exam  Constitutional: She is oriented to person, place, and time. She appears well-developed and well-nourished.  HENT:  Head: Normocephalic and atraumatic.  Eyes: EOM are normal.  Neck: Normal range of motion.  GI: There is no tenderness.       Gravid, no fundal tenderness  Genitourinary: Vagina normal and uterus normal.  Musculoskeletal: She exhibits edema.  Neurological: She is alert and oriented to person, place, and time.  Skin: Skin is warm and dry.    CVX: 0.5-1/50/-4, softer and thinner than previous.  Foley bulb attempted but discontinued due to pain. Speculum placed which caused pt discomfort. Cervix difficult to visualize.   Cervix prepped with betadine which caused more discomfort. Attempted to insert bulb and inflate after catheter places in cervical os.  Discontinued due to pain. Pt given 1 mg Stadol and placement tried again.  Pt unable to tolerate inflation. Procedure discontinued. If need to attempt to replace bulb, will use a longer Graves speculum. Prenatal labs: ABO, Rh: O/Positive/-- (09/21 0000) Antibody: Negative (09/21 0000) Rubella: Immune (09/21 0000) RPR: NON REACTIVE (05/19 2000)  HBsAg: Negative (09/21 0000)  HIV: Non-reactive (09/21 0000)  GBS: Negative (04/27 0000)   Assessment/Plan: Postdates induction at 41  4/7 weeks, unfavorable cervix.  Pt previously counseled on risks of induction to include c-section, fetal distress. IOL. S/p Cytotec once.  Unable to place Foley bulb. Start Pitocin at 1mu and increase by 1-2 mu based on contraction pattern.   [name] 06/11/2011, 8:45 AM

## 2011-06-11 NOTE — Progress Notes (Signed)
Ashley Mcguire is a 26 y.o. G1P0 at [redacted]w[redacted]d by LMP admitted for induction of labor due to Post dates. Due date 06/08/2011.  Subjective: Pt reports she has an increased in pain, contractions 6/10.  Requesting pain medication.  Declines foley bulb due to pain.  No significant bleeding, no LOF.  Pt is nauseous and requesting medication.  Objective: BP 103/65  Pulse 69  Temp(Src) 98.2 F (36.8 C) (Oral)  Resp 18  Ht 5\' 8"  (1.727 m)  Wt 92.534 kg (204 lb)  BMI 31.02 kg/m2      FHT:  Reactive no decels UC:   q 2-3 minute, coupling SVE:  1/60/-3  Labs: Lab Results  Component Value Date   WBC 11.3* 06/10/2011   HGB 11.1* 06/10/2011   HCT 32.8* 06/10/2011   MCV 89.6 06/10/2011   PLT 165 06/10/2011    Assessment / Plan: Induction of labor due to postterm,  progressing well on pitocin Cervix is ripening.   Suspect OP.  RN to change positions of pt. Zofran for nausea.  Labor: Minimal cervical change on Pitocin, but contractions are getting stronger Preeclampsia:  no signs or symptoms of toxicity Fetal Wellbeing:  Category I Pain Control:  Epidural and Stadol prn I/D:  n/a Anticipated MOD:  NSVD if active labor achieved  Caeleb Batalla 06/11/2011, 12:46 PM

## 2011-06-11 NOTE — Progress Notes (Signed)
Notified Dr. Neva Seat to update with SVE and unable to give 2nd dose of cytotec. Will continue to monitor and no new orders received

## 2011-06-11 NOTE — Anesthesia Procedure Notes (Signed)
Epidural Patient location during procedure: OB Start time: 06/11/2011 2:23 PM Reason for block: procedure for pain  Staffing Performed by: anesthesiologist   Preanesthetic Checklist Completed: patient identified, site marked, surgical consent, pre-op evaluation, timeout performed, IV checked, risks and benefits discussed and monitors and equipment checked  Epidural Patient position: sitting Prep: site prepped and draped and DuraPrep Patient monitoring: continuous pulse ox and blood pressure Approach: midline Injection technique: LOR air  Needle:  Needle type: Tuohy  Needle gauge: 17 G Needle length: 9 cm Needle insertion depth: 5 cm cm Catheter type: closed end flexible Catheter size: 19 Gauge Catheter at skin depth: 10 cm Test dose: negative  Assessment Events: blood not aspirated, injection not painful, no injection resistance, negative IV test and no paresthesia  Additional Notes Discussed risk of headache, infection, bleeding, nerve injury and failed or incomplete block.  Patient voices understanding and wishes to proceed.

## 2011-06-11 NOTE — Progress Notes (Signed)
Patient ID: Ashley Mcguire, female   DOB: 06-Feb-1985, 26 y.o.   MRN: [id number]  Pt comfortable s/p epidural. AFVSS Gen: NAD Cvx: Anterior lip/+1 station per RN EM:  Reassuring, early and variable decels, good variability Toco:  2-3 minutes Pitocin 8 mus A/P:  Active labor, s/p epidural Reassuring fetal status. Push when +2 station.

## 2011-06-11 NOTE — Anesthesia Preprocedure Evaluation (Signed)
Anesthesia Evaluation  Patient identified by MRN, date of birth, ID band Patient awake    Reviewed: Allergy & Precautions, H&P , NPO status , Patient's Chart, lab work & pertinent test results, reviewed documented beta blocker date and time   History of Anesthesia Complications Negative for: history of anesthetic complications  Airway Mallampati: II TM Distance: >3 FB Neck ROM: full    Dental  (+) Teeth Intact   Pulmonary neg pulmonary ROS,  breath sounds clear to auscultation        Cardiovascular negative cardio ROS  Rhythm:regular Rate:Normal     Neuro/Psych  Headaches (migraines), negative psych ROS   GI/Hepatic negative GI ROS, Neg liver ROS,   Endo/Other  negative endocrine ROS  Renal/GU negative Renal ROS  negative genitourinary   Musculoskeletal   Abdominal   Peds  Hematology negative hematology ROS (+)   Anesthesia Other Findings   Reproductive/Obstetrics (+) Pregnancy                           Anesthesia Physical Anesthesia Plan  ASA: II  Anesthesia Plan: Epidural   Post-op Pain Management:    Induction:   Airway Management Planned:   Additional Equipment:   Intra-op Plan:   Post-operative Plan:   Informed Consent: I have reviewed the patients History and Physical, chart, labs and discussed the procedure including the risks, benefits and alternatives for the proposed anesthesia with the patient or authorized representative who has indicated his/her understanding and acceptance.     Plan Discussed with:   Anesthesia Plan Comments:         Anesthesia Quick Evaluation

## 2011-06-12 LAB — CBC
HCT: 25.9 % — ABNORMAL LOW (ref 36.0–46.0)
Hemoglobin: 8.8 g/dL — ABNORMAL LOW (ref 12.0–15.0)
MCV: 90.2 fL (ref 78.0–100.0)
RBC: 2.87 MIL/uL — ABNORMAL LOW (ref 3.87–5.11)
WBC: 18 10*3/uL — ABNORMAL HIGH (ref 4.0–10.5)

## 2011-06-12 NOTE — Progress Notes (Signed)
Post Partum Day 1 s/p svd  Subjective: no complaints, up ad lib, voiding and tolerating PO  Objective: Blood pressure 96/64, pulse 76, temperature 98.5 F (36.9 C), temperature source Oral, resp. rate 18, height 5\' 8"  (1.727 m), weight 92.534 kg (204 lb), SpO2 95.00%, unknown if currently breastfeeding.  Physical Exam:  General: alert and cooperative Lochia: appropriate Uterine Fundus: firm Incision: NA DVT Evaluation: No evidence of DVT seen on physical exam.   Basename 06/12/11 0520 06/10/11 2000  HGB 8.8* 11.1*  HCT 25.9* 32.8*    Assessment/Plan: Plan for discharge tomorrow, Breastfeeding and Lactation consult   LOS: 2 days   Hani Campusano J. 06/12/2011, 8:36 AM

## 2011-06-12 NOTE — Anesthesia Postprocedure Evaluation (Signed)
  Anesthesia Post Note  Patient: Ashley Mcguire  Procedure(s) Performed: * No procedures listed *  Anesthesia type: Epidural  Patient location: Mother/Baby  Post pain: Pain level controlled  Post assessment: Post-op Vital signs reviewed  Last Vitals:  Filed Vitals:   06/12/11 0920  BP: 91/59  Pulse: 86  Temp: 36.6 C  Resp: 18    Post vital signs: Reviewed  Level of consciousness:alert  Complications: No apparent anesthesia complications

## 2011-06-12 NOTE — Discharge Instructions (Signed)
Postpartum Care After Vaginal Delivery  After you deliver your baby, you will stay in the hospital for 24 to 72 hours, unless there were problems with the labor or delivery, or you have medical problems. While you are in the hospital, you will receive help and instructions on how to care for yourself and your baby.  Your doctor will order pain medicine, in case you need it. You will have a small amount of bleeding from your vagina and should change your sanitary pad frequently. Wash your hands thoroughly with soap and water for at least 20 seconds after changing pads and using the toilet. Let the nurses know if you begin to pass blood clots or your bleeding increases. Do not flush blood clots down the toilet before having the nurse look at them, to make sure there is no placental tissue with them.  If you had an intravenous (IV), it will be removed within 24 hours, if there are no problems. The first time you get out of bed or take a shower, call the nurse to help you because you may get weak, lightheaded, or even faint. If you are breastfeeding, you may feel painful contractions of your uterus for a couple of weeks. This is normal. The contractions help your uterus get back to normal size. If you are not breastfeeding, wear a supportive bra and handle your breasts as little as possible until your milk has dried up. Hormones should not be given to dry up the breasts, because they can cause blood clots. You will be given your normal diet, unless you have diabetes or other medical problems.   The nurses may put an ice pack on your episiotomy (surgically enlarged opening), if you have one, to reduce the pain and swelling. On rare occasions, you may not be able to urinate and the nurse will need to empty your bladder with a catheter. If you had a postpartum tubal ligation ("tying tubes," female sterilization), it should not make your stay in the hospital longer.  You may have your baby in your room with you as much as  you like, unless you or the baby has a problem. Use the bassinet (basket) for the baby when going to and from the nursery. Do not carry the baby. Do not leave the postpartum area. If the mother is Rh negative (lacks a protein on the red blood cells) and the baby is Rh positive, the mother should get a Rho-gam shot to prevent Rh problems with future pregnancies.  You may be given written instructions for you and your baby, and necessary medicines, when you are discharged from the hospital. Be sure you understand and follow the instructions as advised.  HOME CARE INSTRUCTIONS   · Follow instructions and take the medicines given to you.   · Only take over-the-counter or prescription medicines for pain, discomfort, or fever as directed by your caregiver.   · Do not take aspirin, because it can cause bleeding.   · Increase your activities a little bit every day to build up your strength and endurance.   · Do not drink alcohol, especially if you are breastfeeding or taking pain medicine.   · Take your temperature twice a day and record it.   · You may have a small amount of bleeding or spotting for 2 to 4 weeks. This is normal.   · Do not use tampons or douche. Use sanitary pads.   · Try to have someone stay and help you for a   the baby is sleeping.   If you are breastfeeding, wear a good support bra. If you are not breastfeeding, wear a supportive bra and do not stimulate your nipples.   Eat a healthy, nutritious diet and continue to take your prenatal vitamins.   Do not drive, do any heavy activities, or travel until your caregiver tells you it is okay.   Do not have intercourse until your caregiver gives you permission to do so.   Ask your caregiver when you can begin to exercise and what type of exercises to do.   Call your caregiver if you think you are having a problem from your delivery.   Call your pediatrician if you are having a problem  with the baby.   Schedule your postpartum visit and keep it.  SEEK MEDICAL CARE IF:   You have a temperature of 100.4 F (37.8 C) or higher.   You have increased vaginal bleeding or are passing clots. Save any clots to show your caregiver.   You have bloody urine or pain when you urinate.   You have a bad smelling vaginal discharge.   You have increasing pain or swelling on your episiotomy.   You develop a severe headache.   You feel depressed.   The episiotomy is separating.   You become dizzy or lightheaded.   You develop a rash.   You have a reaction or problems with your medicine.   You have pain, redness, or swelling at the intravenous site.  SEEK IMMEDIATE MEDICAL CARE IF:   You have chest pain.   You develop shortness of breath.   You pass out.   You develop pain, with or without swelling or redness in your leg.   You develop heavy vaginal bleeding, with or without blood clots.   You develop stomach pain.   You develop a bad smelling vaginal discharge.  MAKE SURE YOU:   Understand these instructions.   Will watch your condition.   Will get help right away if you are not doing well or get worse.  Document Released: 11/05/2006 Document Revised: 12/28/2010 Document Reviewed: 11/17/2008 [facility] Patient Information 2012 [place], [place].

## 2011-06-13 MED ORDER — FERROUS GLUCONATE IRON 246 (28 FE) MG PO TABS: ORAL_TABLET | ORAL | Status: DC

## 2011-06-13 MED ORDER — IBUPROFEN 600 MG PO TABS: 600.0000 mg | ORAL_TABLET | Freq: Four times a day (QID) | ORAL | Status: AC

## 2011-06-13 MED ORDER — PRENATAL MULTIVITAMIN CH: ORAL_TABLET | ORAL | Status: AC

## 2011-06-13 NOTE — Discharge Summary (Signed)
Obstetric Discharge Summary Reason for Admission: induction of labor Prenatal Procedures: ultrasound Intrapartum Procedures: spontaneous vaginal delivery Postpartum Procedures: none Complications-Operative and Postpartum: First degree perineal laceration Hemoglobin  Date Value Range Status  06/12/2011 8.8* 12.0-15.0 (g/dL) Final     DELTA CHECK NOTED     REPEATED TO VERIFY     HCT  Date Value Range Status  06/12/2011 25.9* 36.0-46.0 (%) Final    Physical Exam:  General: alert, cooperative and no distress Lochia: appropriate Uterine Fundus: firm Incision: healing well DVT Evaluation: No significant calf/ankle edema. Calf/Ankle edema is present.  Discharge Diagnoses: Term Pregnancy-delivered  Discharge Information: Date: 06/13/2011 Activity: pelvic rest Diet: See discharge instructions. Medications: PNV, Ibuprofen and Iron Condition: stable Instructions: See discharge instructions. Discharge to: home Follow-up Information    Follow up with [name], MD in 6 weeks. (Postpartum check)    Contact information:   [address] [place], [place]. [address] [place] [address] [phone]          Newborn Data: Live born female  Birth Weight: 8 lb 14.3 oz (4035 g) APGAR: 9, 9  Home with mother.  [name] 06/13/2011, 8:46 AM

## 2011-06-13 NOTE — Progress Notes (Signed)
Post Partum Day  Subjective: no complaints  Breast feeding ok.  Has blisters on her nipples.  Discussed contraception.  Pt reports mood changes on OCPs.  Objective: Blood pressure 105/64, pulse 81, temperature 98.2 F (36.8 C), temperature source Oral, resp. rate 18, height 5\' 8"  (1.727 m), weight 92.534 kg (204 lb), SpO2 97.00%, unknown if currently breastfeeding.  Physical Exam:  General: alert and cooperative Lochia: normal per pt Uterine Fundus: firm Incision: Not assessed DVT Evaluation: No cords or calf tenderness. Calf/Ankle edema is present.   Basename 06/12/11 0520 06/10/11 2000  HGB 8.8* 11.1*  HCT 25.9* 32.8*    Assessment/Plan: Discharge home, Breastfeeding and Contraception IUD.  Likely Paragard. Postpartum depression and preeclampsia precautions given. F/u in 6 weeks.   LOS: 3 days   Darleny Sem 06/13/2011, 8:36 AM

## 2013-07-06 IMAGING — CR DG CHEST 2V
2 series · 2 of 2 positions shown · non-contrast
Comparison: None.

CLINICAL DATA: Hypoxemia with difficulty breathing and cough for 3
weeks.  16 weeks pregnant.

CHEST - 2 VIEW

[view not recorded (1 of 2)]
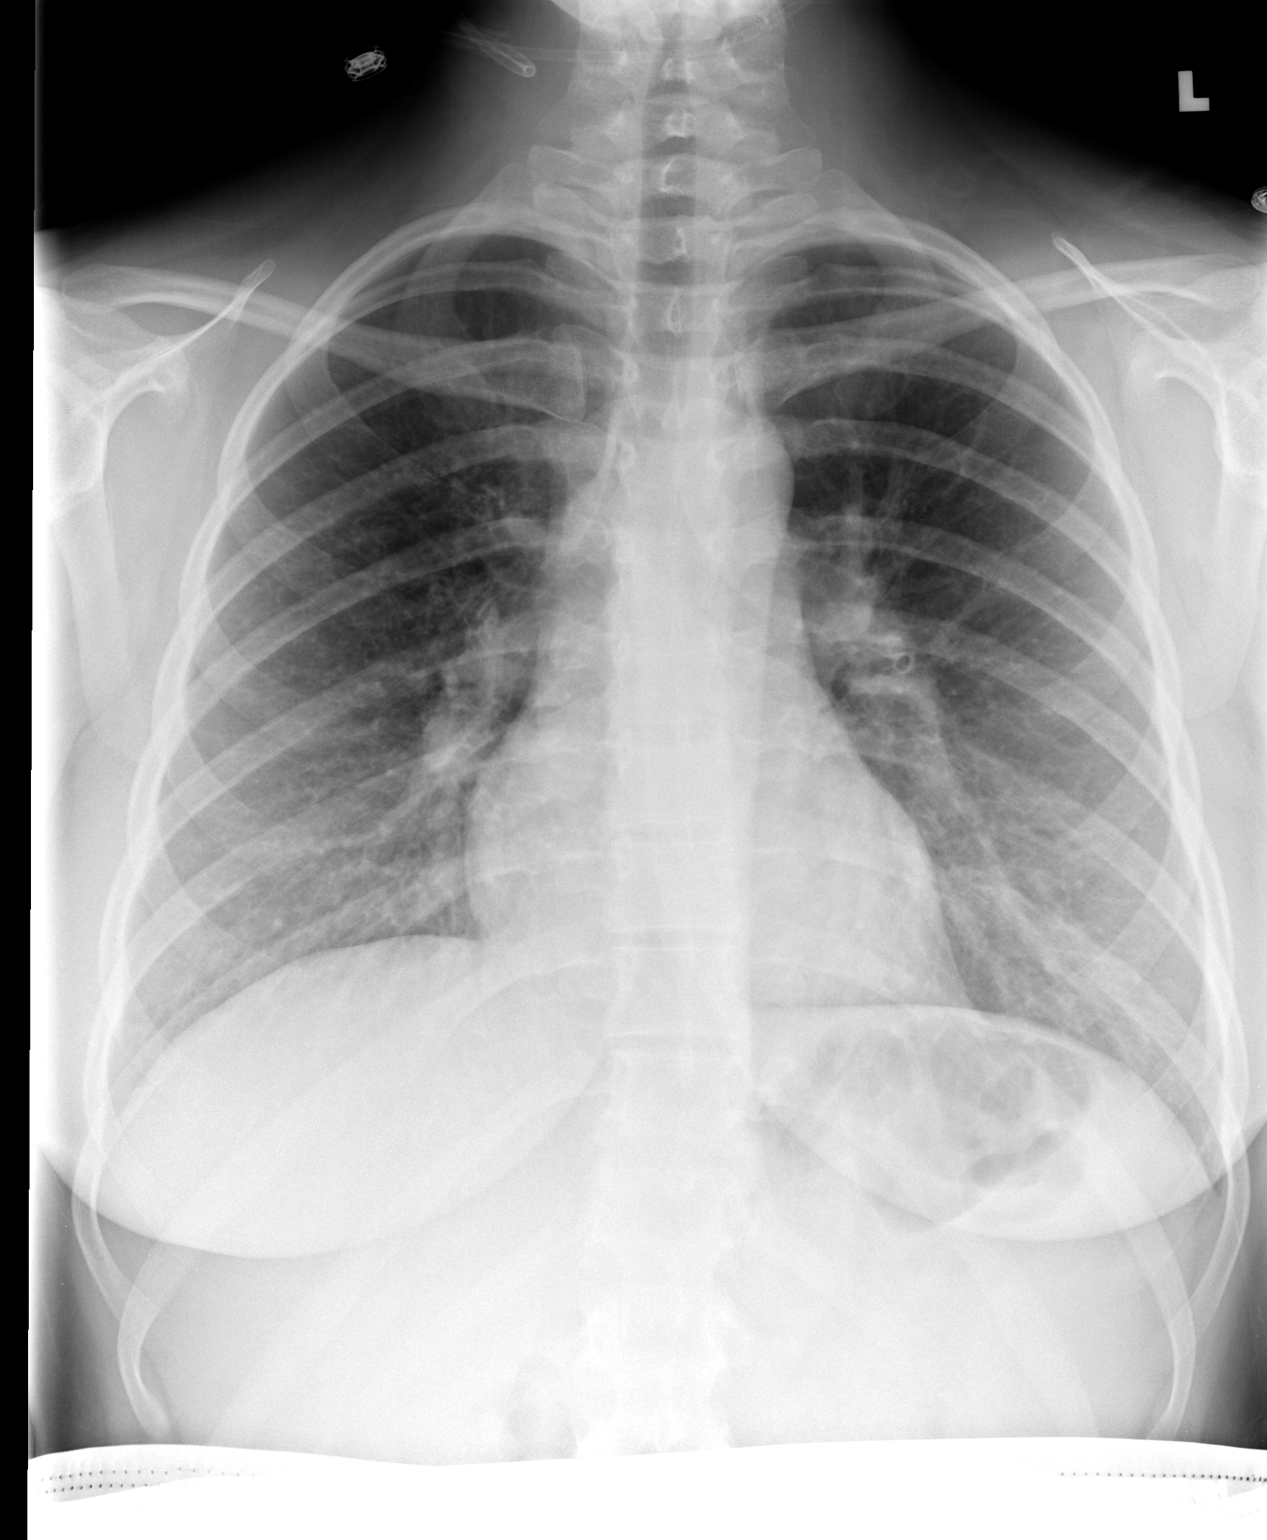

[view not recorded (2 of 2)]
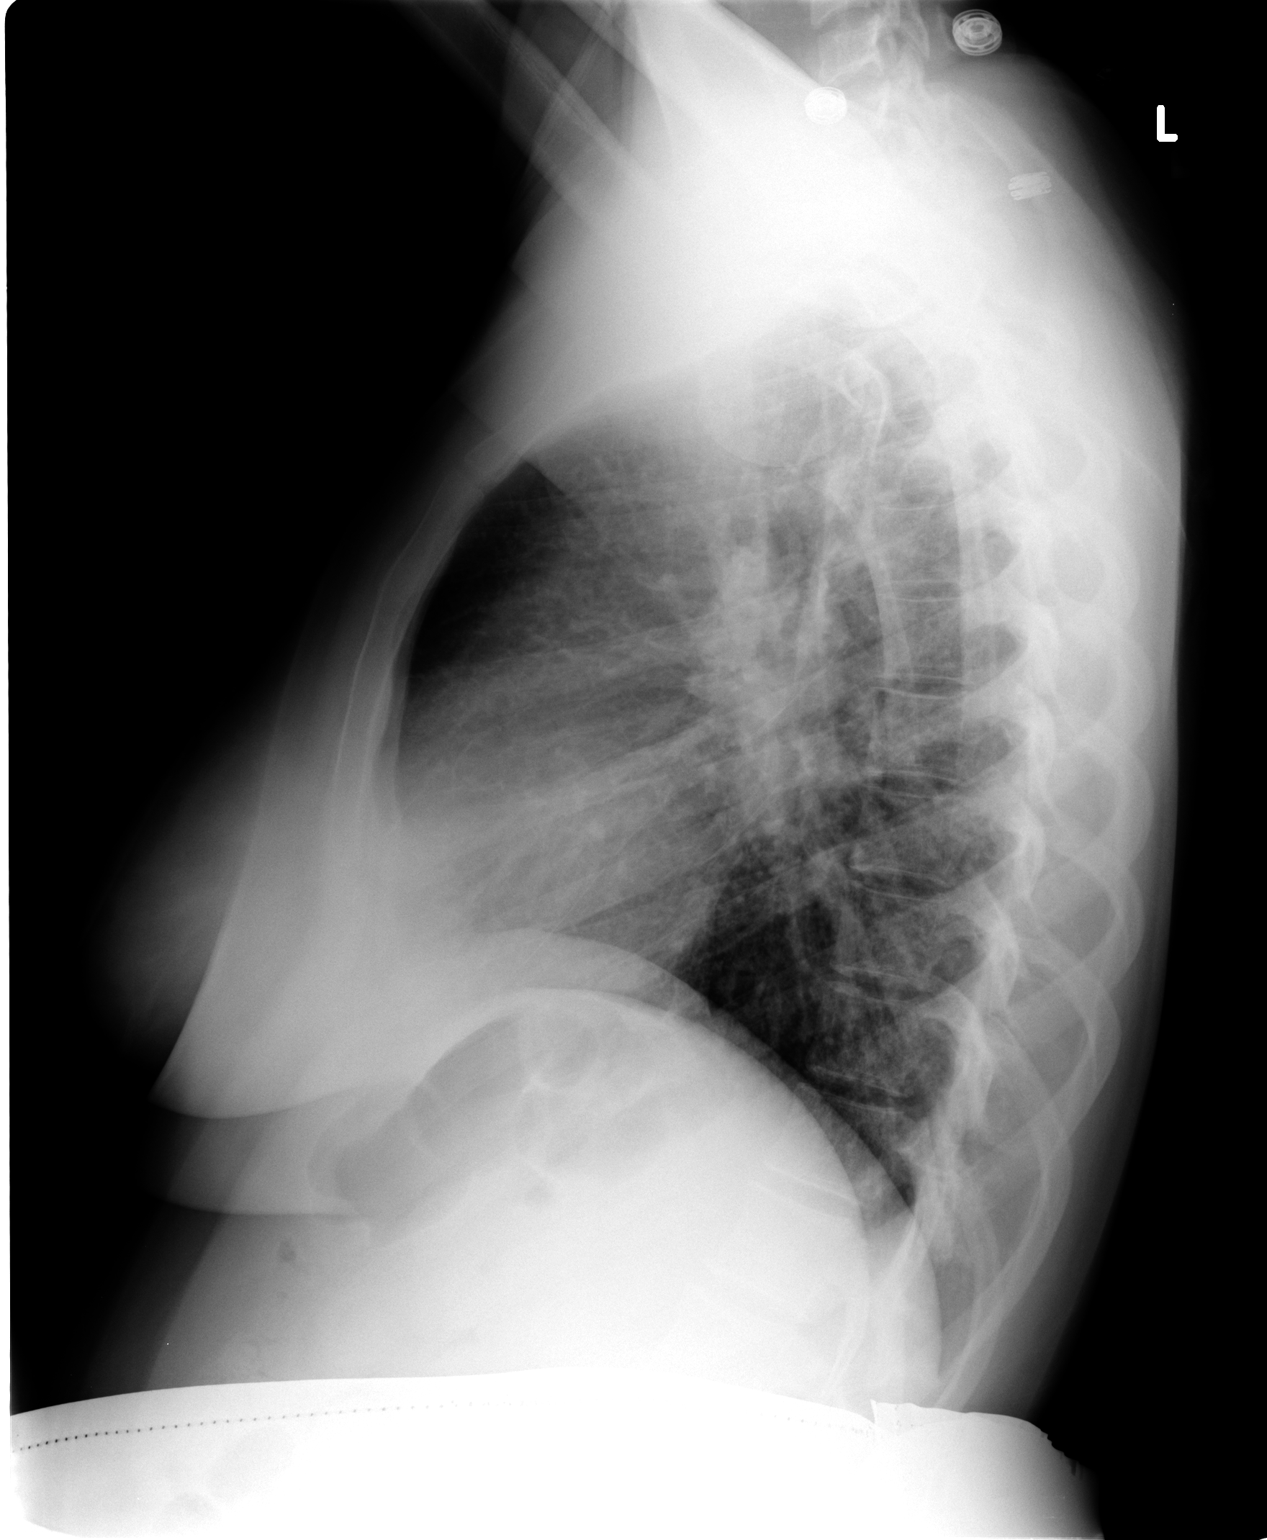

[2 of 2 positions shown; findings below may reference images not displayed]

FINDINGS: The heart size and mediastinal contours are normal.  The
pulmonary vascularity is normal for gravid state.  The lungs are
clear.  There is no pleural effusion or pneumothorax.  Osseous
structures appear normal.
IMPRESSION: No active cardiopulmonary process.

## 2013-11-23 ENCOUNTER — Encounter (HOSPITAL_COMMUNITY): Payer: Self-pay

## 2014-03-03 ENCOUNTER — Other Ambulatory Visit (HOSPITAL_COMMUNITY)
Admission: RE | Admit: 2014-03-03 | Discharge: 2014-03-03 | Disposition: A | Discharge status: Home / Self Care | Payer: 59 | Source: Ambulatory Visit | Attending: Obstetrics and Gynecology | Admitting: Obstetrics and Gynecology

## 2014-03-03 DIAGNOSIS — N760 Acute vaginitis: Secondary | ICD-10-CM | POA: Diagnosis present

## 2014-03-03 DIAGNOSIS — Z01419 Encounter for gynecological examination (general) (routine) without abnormal findings: Secondary | ICD-10-CM | POA: Insufficient documentation

## 2015-08-06 ENCOUNTER — Ambulatory Visit: Payer: Self-pay

## 2017-09-16 ENCOUNTER — Other Ambulatory Visit: Payer: Self-pay | Admitting: Obstetrics and Gynecology

## 2017-09-16 ENCOUNTER — Other Ambulatory Visit (HOSPITAL_COMMUNITY)
Admission: RE | Admit: 2017-09-16 | Discharge: 2017-09-16 | Disposition: A | Discharge status: Home / Self Care | Payer: 59 | Source: Ambulatory Visit | Attending: Obstetrics and Gynecology | Admitting: Obstetrics and Gynecology

## 2017-09-16 DIAGNOSIS — Z124 Encounter for screening for malignant neoplasm of cervix: Secondary | ICD-10-CM | POA: Diagnosis not present

## 2017-09-16 DIAGNOSIS — Z01419 Encounter for gynecological examination (general) (routine) without abnormal findings: Secondary | ICD-10-CM | POA: Insufficient documentation

## 2017-09-17 LAB — CYTOLOGY - PAP

## 2017-09-18 LAB — CYTOLOGY - PAP
Chlamydia: NEGATIVE
DIAGNOSIS: NEGATIVE
HPV: NOT DETECTED
NEISSERIA GONORRHEA: NEGATIVE

## 2021-02-09 ENCOUNTER — Other Ambulatory Visit: Payer: Self-pay | Admitting: Obstetrics and Gynecology

## 2021-02-09 DIAGNOSIS — N979 Female infertility, unspecified: Secondary | ICD-10-CM

## 2021-02-09 DIAGNOSIS — Z3169 Encounter for other general counseling and advice on procreation: Secondary | ICD-10-CM

## 2021-03-06 ENCOUNTER — Ambulatory Visit
Admission: RE | Admit: 2021-03-06 | Discharge: 2021-03-06 | Disposition: A | Discharge status: Home / Self Care | Payer: 59 | Source: Ambulatory Visit | Attending: Obstetrics and Gynecology | Admitting: Obstetrics and Gynecology

## 2021-03-06 DIAGNOSIS — Z3169 Encounter for other general counseling and advice on procreation: Secondary | ICD-10-CM

## 2021-03-06 DIAGNOSIS — N979 Female infertility, unspecified: Secondary | ICD-10-CM

## 2021-07-12 ENCOUNTER — Ambulatory Visit (INDEPENDENT_AMBULATORY_CARE_PROVIDER_SITE_OTHER): Payer: 59

## 2021-07-12 ENCOUNTER — Ambulatory Visit (HOSPITAL_COMMUNITY)
Admission: EM | Admit: 2021-07-12 | Discharge: 2021-07-12 | Disposition: A | Discharge status: Home / Self Care | Payer: 59 | Attending: Family Medicine | Admitting: Family Medicine

## 2021-07-12 ENCOUNTER — Encounter (HOSPITAL_COMMUNITY): Payer: Self-pay | Admitting: Emergency Medicine

## 2021-07-12 DIAGNOSIS — R062 Wheezing: Secondary | ICD-10-CM

## 2021-07-12 DIAGNOSIS — R059 Cough, unspecified: Secondary | ICD-10-CM | POA: Diagnosis not present

## 2021-07-12 DIAGNOSIS — R051 Acute cough: Secondary | ICD-10-CM | POA: Diagnosis not present

## 2021-07-12 DIAGNOSIS — R0602 Shortness of breath: Secondary | ICD-10-CM | POA: Diagnosis not present

## 2021-07-12 DIAGNOSIS — J302 Other seasonal allergic rhinitis: Secondary | ICD-10-CM | POA: Diagnosis not present

## 2021-07-12 MED ORDER — PREDNISONE 20 MG PO TABS: 40.0000 mg | ORAL_TABLET | Freq: Every day | ORAL | 0 refills | Status: DC

## 2021-07-12 MED ORDER — BENZONATATE 100 MG PO CAPS: ORAL_CAPSULE | ORAL | 0 refills | Status: DC

## 2021-07-12 MED ORDER — ALBUTEROL SULFATE HFA 108 (90 BASE) MCG/ACT IN AERS: 1.0000 | INHALATION_SPRAY | Freq: Four times a day (QID) | RESPIRATORY_TRACT | 2 refills | Status: AC | PRN

## 2021-07-12 MED ORDER — MONTELUKAST SODIUM 10 MG PO TABS: 10.0000 mg | ORAL_TABLET | Freq: Every day | ORAL | 2 refills | Status: AC

## 2021-07-12 NOTE — ED Triage Notes (Signed)
Patient c/o SOB and nonproductive cough x 2 months.  Patient denies fever.   Patient endorses symptoms have worsened over the past 2 days.   Patient endorses wheezing. Patient endorses chest tightness.   Patient endorses being diagnosed with bronchitis last month.   Patient was given a steroid and inhaler last month with some relief but symptoms have recently become worse.   Patient has taken Mucinex and Delysum with no relief of symptoms.   History of Pneumonia.

## 2021-07-13 NOTE — ED Provider Notes (Signed)
[facility]   [id number] 07/12/21 Arrival Time: 1652  ASSESSMENT & PLAN:  1. Wheezing   2. Acute cough   3. Seasonal allergies    I have personally viewed the imaging studies ordered this visit. Normal CXR.  Question allergic trigger. No formal asthma dx. Begin: Meds ordered this encounter  Medications   montelukast (SINGULAIR) 10 MG tablet    Sig: Take 1 tablet (10 mg total) by mouth at bedtime.    Dispense:  30 tablet    Refill:  2   albuterol (VENTOLIN HFA) 108 (90 Base) MCG/ACT inhaler    Sig: Inhale 1-2 puffs into the lungs every 6 (six) hours as needed for wheezing or shortness of breath.    Dispense:  1 each    Refill:  2   benzonatate (TESSALON) 100 MG capsule    Sig: Take 1 capsule by mouth every 8 (eight) hours for cough.    Dispense:  21 capsule    Refill:  0   predniSONE (DELTASONE) 20 MG tablet    Sig: Take 2 tablets (40 mg total) by mouth daily.    Dispense:  14 tablet    Refill:  0   Recommend:  Follow-up Information     Schedule an appointment as soon as possible for a visit  with [facility].   Contact information: [address] [place] [address]                Reviewed expectations re: course of current medical issues. Questions answered. Outlined signs and symptoms indicating need for more acute intervention. Patient verbalized understanding. After Visit Summary given.  SUBJECTIVE: History from: patient.  Ashley Mcguire is a 36 y.o. female who presents with complaint of intermittent wheezing and SOB. With non-prod cough. On/off several times a year. Afebrile. Steroids have helped in the past. No CP reported.  Social History   Tobacco Use  Smoking Status Never  Smokeless Tobacco Not on file   OBJECTIVE:  Vitals:   07/12/21 1756 07/12/21 1801  BP:  136/70  Pulse: (!) 52   Resp: 16   Temp: 98.1 F (36.7 C)   TempSrc: Oral   SpO2: 100%      General  appearance: alert; NAD HEENT: [place]; AT; with mild nasal congestion Neck: supple without LAD Cv: RRR without murmer Lungs: unlabored respirations, moderate bilateral expiratory wheezing; cough: mild; no significant respiratory distress Skin: warm and dry Psychological: alert and cooperative; normal mood and affect  Imaging: DG Chest 2 View  Result Date: 07/12/2021 CLINICAL DATA:  Short of breath and cough EXAM: CHEST - 2 VIEW COMPARISON:  12/20/2010 FINDINGS: The heart size and mediastinal contours are within normal limits. Both lungs are clear. The visualized skeletal structures are unremarkable. IMPRESSION: No active cardiopulmonary disease. Electronically Signed   By: Marlan Palau M.D.   On: 07/12/2021 18:30    Allergies  Allergen Reactions   Imitrex [Sumatriptan Base] Anaphylaxis   Maxalt [Rizatriptan Benzoate] Anaphylaxis   Clams [Shellfish Allergy] Hives    Past Medical History:  Diagnosis Date   Bronchitis    during pregnancy   GERD (gastroesophageal reflux disease)    Headache(784.0)    No pertinent past medical history    Strep throat    during pregnancy   History reviewed. No pertinent family history. Social History   Socioeconomic History   Marital status: Married    Spouse name: Not on file   Number  of children: Not on file   Years of education: Not on file   Highest education level: Not on file  Occupational History   Not on file  Tobacco Use   Smoking status: Never   Smokeless tobacco: Not on file  Substance and Sexual Activity   Alcohol use: No   Drug use: No   Sexual activity: Yes    Birth control/protection: None  Other Topics Concern   Not on file  Social History Narrative   Not on file   Social Determinants of Health   Financial Resource Strain: Not on file  Food Insecurity: Not on file  Transportation Needs: Not on file  Physical Activity: Not on file  Stress: Not on file  Social Connections: Not on file  Intimate Partner Violence: Not  on file             Mardella Layman, MD 07/13/21 [phone]

## 2023-02-25 ENCOUNTER — Other Ambulatory Visit (HOSPITAL_COMMUNITY): Payer: Self-pay

## 2023-02-25 MED ORDER — BUDESONIDE-FORMOTEROL FUMARATE 80-4.5 MCG/ACT IN AERO
2.0000 | INHALATION_SPRAY | Freq: Two times a day (BID) | RESPIRATORY_TRACT | 5 refills | Status: AC
  Filled 2023-02-25: qty 10.2, 30d supply, fill #0

## 2023-03-07 ENCOUNTER — Other Ambulatory Visit (HOSPITAL_COMMUNITY): Payer: Self-pay

## 2023-05-27 ENCOUNTER — Encounter (HOSPITAL_COMMUNITY): Payer: Self-pay

## 2023-05-27 ENCOUNTER — Inpatient Hospital Stay (HOSPITAL_COMMUNITY)
Admission: AD | Admit: 2023-05-27 | Discharge: 2023-05-28 | Disposition: A | Discharge status: Home / Self Care | Attending: Obstetrics and Gynecology | Admitting: Obstetrics and Gynecology

## 2023-05-27 ENCOUNTER — Inpatient Hospital Stay (HOSPITAL_COMMUNITY)

## 2023-05-27 DIAGNOSIS — Z79899 Other long term (current) drug therapy: Secondary | ICD-10-CM | POA: Diagnosis not present

## 2023-05-27 DIAGNOSIS — O039 Complete or unspecified spontaneous abortion without complication: Secondary | ICD-10-CM

## 2023-05-27 DIAGNOSIS — Z3A11 11 weeks gestation of pregnancy: Secondary | ICD-10-CM | POA: Diagnosis not present

## 2023-05-27 DIAGNOSIS — Z6790 Unspecified blood type, Rh positive: Secondary | ICD-10-CM

## 2023-05-27 DIAGNOSIS — O209 Hemorrhage in early pregnancy, unspecified: Secondary | ICD-10-CM

## 2023-05-27 DIAGNOSIS — M549 Dorsalgia, unspecified: Secondary | ICD-10-CM | POA: Insufficient documentation

## 2023-05-27 LAB — CBC
HCT: 36.9 % (ref 36.0–46.0)
Hemoglobin: 12.7 g/dL (ref 12.0–15.0)
MCH: 29.1 pg (ref 26.0–34.0)
MCHC: 34.4 g/dL (ref 30.0–36.0)
MCV: 84.4 fL (ref 80.0–100.0)
Platelets: 195 10*3/uL (ref 150–400)
RBC: 4.37 MIL/uL (ref 3.87–5.11)
RDW: 12.7 % (ref 11.5–15.5)
WBC: 11.5 10*3/uL — ABNORMAL HIGH (ref 4.0–10.5)
nRBC: 0 % (ref 0.0–0.2)

## 2023-05-27 LAB — HCG, QUANTITATIVE, PREGNANCY: hCG, Beta Chain, Quant, S: 18034 m[IU]/mL — ABNORMAL HIGH (ref <5)

## 2023-05-27 MED ORDER — IBUPROFEN 800 MG PO TABS
800.0000 mg | ORAL_TABLET | Freq: Once | ORAL | Status: AC
  Administered 2023-05-27: 800 mg via ORAL
  Filled 2023-05-27: qty 1

## 2023-05-27 MED ORDER — IBUPROFEN 600 MG PO TABS: 600.0000 mg | ORAL_TABLET | Freq: Four times a day (QID) | ORAL | 0 refills | Status: AC | PRN

## 2023-05-27 NOTE — Discharge Instructions (Signed)
 You were seen in the maternity assessment unit for vaginal bleeding in the setting of a concern for miscarriage.  Your ultrasound here in the MAU showed that you have had a complete miscarriage meaning the gestational sac in the fetus has passed from your body.  We are so sorry for your loss.  We recommend that you have a follow-up visit with your OB in about 2 weeks.  You can expect that you may have bleeding and spotting for the next 1 to 2 weeks but it should lessen every day.  Please return to the maternity assessment unit if you have fevers, chills, nausea, vomiting, dizziness, lightheadedness, or increased or worsening bleeding.

## 2023-05-27 NOTE — MAU Note (Signed)
..  Ashley Mcguire is a 38 y.o. at [redacted]w[redacted]d here in MAU reporting: patient brought directly to room due to vaginal bleeding. Patient reports she started bleeding at 1730 and has soaked through her clothes. Has been having dark brown spotting on Friday, last night started having bright red streaks and today had a gush of bright red blood with large clots.  Has had lower back and abdominal cramping that began around 1400. Has not taken anything for the pain.   Pain score: 8/10 Vitals:   05/27/23 1958  BP: 131/76  Pulse: 71  Resp: 17  Temp: 98.1 F (36.7 C)  SpO2: 100%     FHT:n/a Lab orders placed from triage:

## 2023-05-27 NOTE — MAU Provider Note (Signed)
 History     CSN: [id number] Arrival date and time: 05/27/23 1901   Event Date/Time   First Provider Initiated Contact with Patient 05/27/23 2126      Chief Complaint  Patient presents with   Vaginal Bleeding   Vaginal Bleeding Pertinent negatives include no abdominal pain, back pain, chills, diarrhea, dysuria, fever, flank pain, nausea, rash, sore throat or vomiting.   Patient is 38 y.o. G2P1001 [redacted]w[redacted]d here with complaints of  bleeding. Pregnancy confirmed by 5 wk US  at [facility]. She reports having some brown spotting about 2 days ago that progressed to red streaking. Despite these patient reports she was in normal health until about 1400 today when she started to have cramping and back pain. She lay down and then she noted a sudden gush like something "let go" and she passed blood and clots. She has not taken anything for the pain. Pain is 8/10 currently. She continues to feel cramping and back pain. Denies dizziness, lightheadedness.   Partner is at bedside and provides history  +FM, denies LOF, VB, contractions, vaginal discharge.  I reviewed the records from [facility] and previous [place] admissions  OB History     Gravida  2   Para  1   Term  1   Preterm  0   AB  0   Living  1      SAB  0   IAB  0   Ectopic  0   Multiple  0   Live Births  1           Past Medical History:  Diagnosis Date   Bronchitis    during pregnancy   GERD (gastroesophageal reflux disease)    Headache(784.0)    No pertinent past medical history    Strep throat    during pregnancy    Past Surgical History:  Procedure Laterality Date   NO PAST SURGERIES      History reviewed. No pertinent family history.  Social History   Tobacco Use   Smoking status: Never  Vaping Use   Vaping status: Never Used  Substance Use Topics   Alcohol use: No   Drug use: No    Allergies:  Allergies  Allergen Reactions   Imitrex [Sumatriptan Base] Anaphylaxis   Maxalt  [Rizatriptan Benzoate] Anaphylaxis    Medications Prior to Admission  Medication Sig Dispense Refill Last Dose/Taking   Prenatal Vit-Fe Fumarate-FA (PRENATAL MULTIVITAMIN) TABS 1 tablet by mouth.  Resume previous prenatal vitamin. 30 tablet 2 05/26/2023   albuterol  (VENTOLIN  HFA) 108 (90 Base) MCG/ACT inhaler Inhale 1-2 puffs into the lungs every 6 (six) hours as needed for wheezing or shortness of breath. 1 each 2    benzonatate  (TESSALON ) 100 MG capsule Take 1 capsule by mouth every 8 (eight) hours for cough. 21 capsule 0    budesonide -formoterol  (SYMBICORT ) 80-4.5 MCG/ACT inhaler Inhale 2 puffs into the lungs 2 (two) times daily. 10.2 g 5    montelukast  (SINGULAIR ) 10 MG tablet Take 1 tablet (10 mg total) by mouth at bedtime. 30 tablet 2    predniSONE  (DELTASONE ) 20 MG tablet Take 2 tablets (40 mg total) by mouth daily. 14 tablet 0     Review of Systems  Constitutional:  Negative for chills and fever.  HENT:  Negative for congestion and sore throat.   Eyes:  Negative for pain and visual disturbance.  Respiratory:  Negative for cough, chest tightness and shortness of breath.   Cardiovascular:  Negative for  chest pain.  Gastrointestinal:  Negative for abdominal pain, diarrhea, nausea and vomiting.  Endocrine: Negative for cold intolerance and heat intolerance.  Genitourinary:  Positive for vaginal bleeding. Negative for dysuria and flank pain.  Musculoskeletal:  Negative for back pain.  Skin:  Negative for rash.  Allergic/Immunologic: Negative for food allergies.  Neurological:  Negative for dizziness and light-headedness.  Psychiatric/Behavioral:  Negative for agitation.    Physical Exam   Blood pressure 131/76, pulse 71, temperature 98.1 F (36.7 C), temperature source Oral, resp. rate 17, height 5\' 7"  (1.702 m), weight 110.7 kg, last menstrual period 03/09/2023, SpO2 100%.  Physical Exam Vitals and nursing note reviewed. Exam conducted with a chaperone present.  Constitutional:       General: She is not in acute distress.    Appearance: She is well-developed.  HENT:     Head: Normocephalic and atraumatic.  Eyes:     General: No scleral icterus.    Conjunctiva/sclera: Conjunctivae normal.  Cardiovascular:     Rate and Rhythm: Normal rate.  Pulmonary:     Effort: Pulmonary effort is normal.  Chest:     Chest wall: No tenderness.  Abdominal:     Palpations: Abdomen is soft.     Tenderness: There is no abdominal tenderness. There is no guarding or rebound.  Genitourinary:    Exam position: Lithotomy position.     Vagina: Bleeding present.     Cervix: Cervical bleeding present.     Comments: There was 3cm of clot in the vaginal vault. Cervical os appears closed with blood coming from cervical os.  Musculoskeletal:        General: Normal range of motion.     Cervical back: Normal range of motion and neck supple.  Skin:    General: Skin is warm and dry.     Findings: No rash.  Neurological:     Mental Status: She is alert and oriented to person, place, and time.   Imaging: US  OB LESS THAN 14 WEEKS WITH OB TRANSVAGINAL Result Date: 05/27/2023 CLINICAL DATA:  [id number] Vaginal bleeding affecting early pregnancy. 11 weeks 2 days by LMP. EXAM: Ob and transvaginal ultrasound TECHNIQUE: Transabdominal and transvaginalultrasound examination of the pelvis was performed including evaluation of the uterus, ovaries, adnexal regions, and pelvic cul-de-sac. COMPARISON:  None Available. FINDINGS: Anteverted uterus measuring 11 x 7 x 5 cm. There is echogenic heterogeneous uterine cavity contents consistent with clot and debris. No intrauterine gestational sac. Images of the adnexa demonstrated an unremarkable left ovary with the 2.5 cm corpus luteum. Ovary measurements 3.7 x 3.2 x 3.1 cm. Right ovary unremarkable, 2.7 x 1.9 x 1.6 cm. No adnexal masses or fluid collections. IMPRESSION: Unremarkable examination of the pelvis. 1. No intrauterine or ectopic pregnancy. 2. Clot/debris in  the uterine cavity. Retained products of conception cannot be excluded, and follow-up recommended depending on subsequent HCG measurements. Electronically Signed   By: Sydell Eva M.D.   On: 05/27/2023 21:42    MAU Course  Procedures  MDM- high  Results for orders placed or performed during the hospital encounter of 05/27/23 (from the past 24 hours)  CBC     Status: Abnormal   Collection Time: 05/27/23  9:13 PM  Result Value Ref Range   WBC 11.5 (H) 4.0 - 10.5 K/uL   RBC 4.37 3.87 - 5.11 MIL/uL   Hemoglobin 12.7 12.0 - 15.0 g/dL   HCT 13.2 44.0 - 10.2 %   MCV 84.4 80.0 - 100.0 fL  MCH 29.1 26.0 - 34.0 pg   MCHC 34.4 30.0 - 36.0 g/dL   RDW 16.1 09.6 - 04.5 %   Platelets 195 150 - 400 K/uL   nRBC 0.0 0.0 - 0.2 %  hCG, quantitative, pregnancy     Status: Abnormal   Collection Time: 05/27/23  9:13 PM  Result Value Ref Range   hCG, Beta Chain, Quant, S 18,034 (H) <5 mIU/mL  ABO/Rh     Status: None   Collection Time: 05/27/23  9:16 PM  Result Value Ref Range   ABO/RH(D) O POS    No rh immune globuloin      NOT A RH IMMUNE GLOBULIN CANDIDATE, PT RH POSITIVE Performed at [facility], [address] [place], [place] [address]    Imaging showed no gestational sac. Previously had live IUP (see medial tab for outpatient clinic visit) Debris likely represents clot  11:49 PM pain has improved and reports minimal bleeding. Discussed results of US  with partner at bedside. Patient is appropriately tearful.   Assessment and Plan  1. Complete miscarriage (Primary)  2. Vaginal bleeding affecting early pregnancy  Discharge home Recommend PRN motrin  Reviewed expectations for healing and bleeding over the next 1-2 weeks Follow up in 2 weeks with primary OB Return for fever, chills, dizziness, lightheadedness, continued or worsening bleeding   Abner Ables 05/27/2023, 9:34 PM

## 2023-05-28 LAB — ABO/RH: ABO/RH(D): O POS

## 2023-09-21 IMAGING — RF DG HYSTEROGRAM
1 series · 8 of 8 positions shown · IV contrast (omnipaque)
Comparison: None.

CLINICAL DATA: Secondary female infertility. History of vaginal
delivery 9 years prior.

EXAM:
HYSTEROSALPINGOGRAM
TECHNIQUE: Following cleansing of the cervix and vagina with Betadine solution,
a hysterosalpingogram was performed using a 5-French
hysterosalpingogram catheter and Omnipaque 300 contrast. The patient
tolerated the examination without difficulty.

[Series 1: one shot · 8 of 8 slices shown]
[im 1/8]
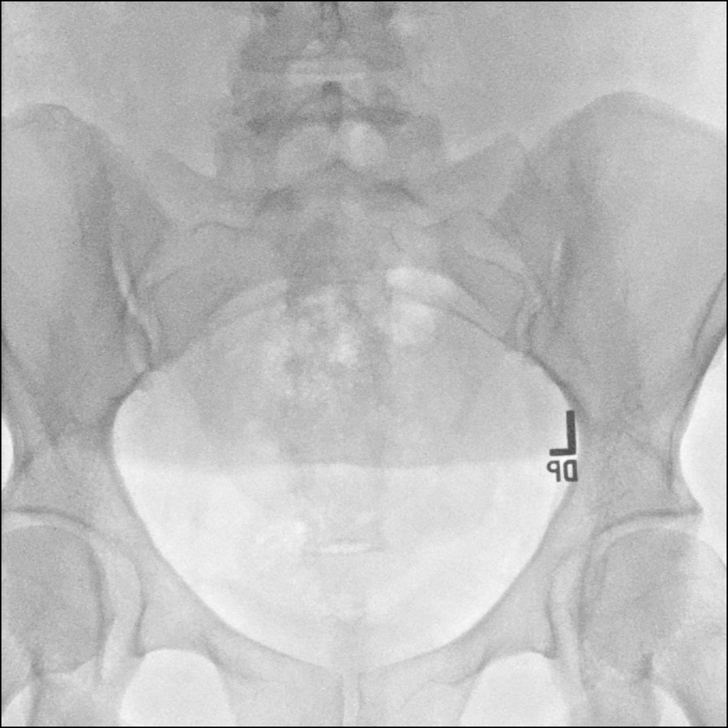
[im 2/8]
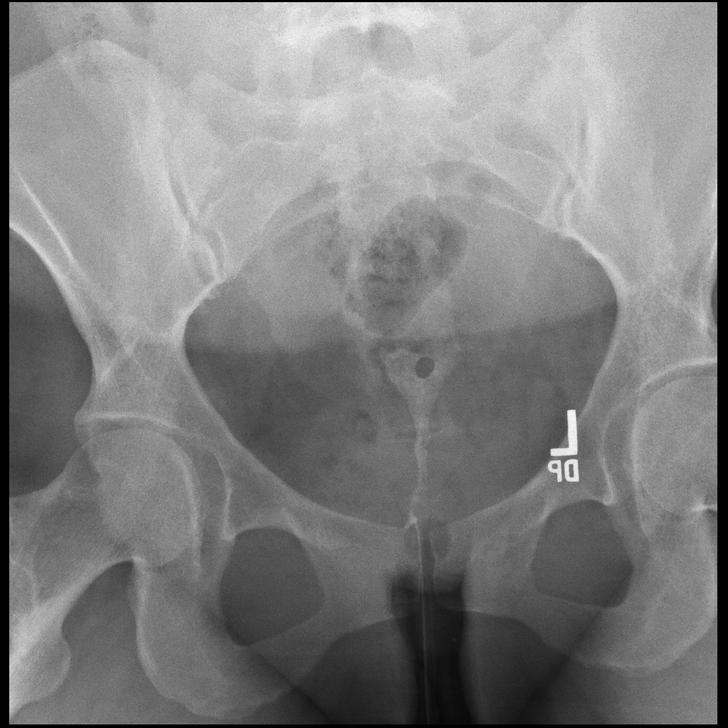
[im 3/8]
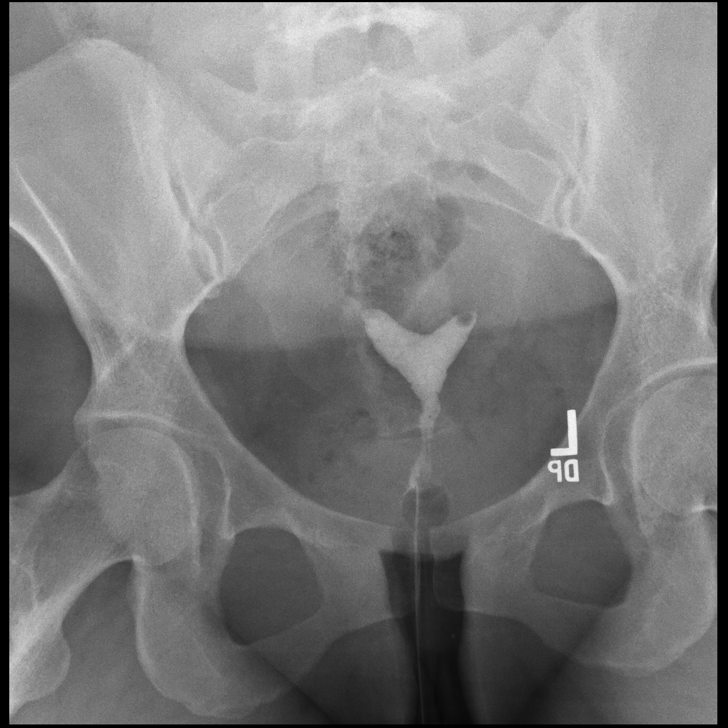
[im 4/8]
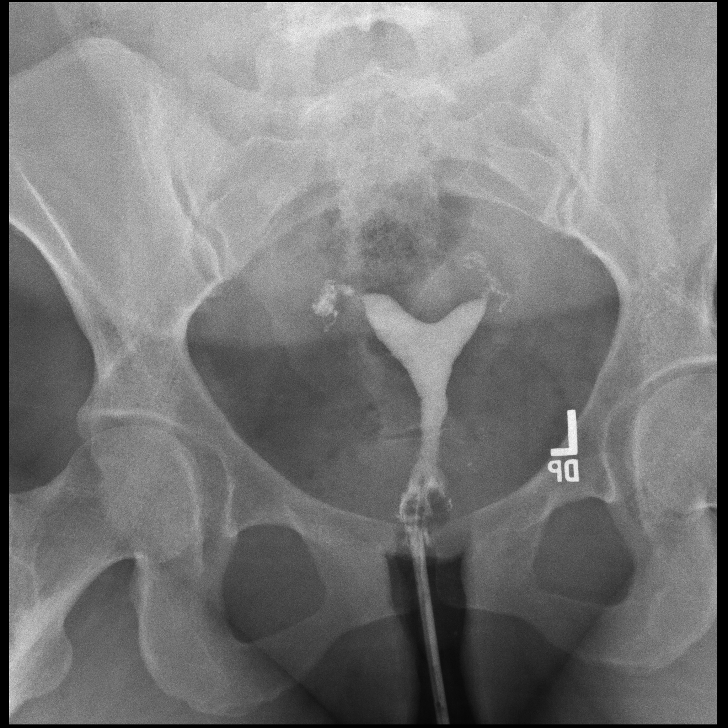
[im 5/8]
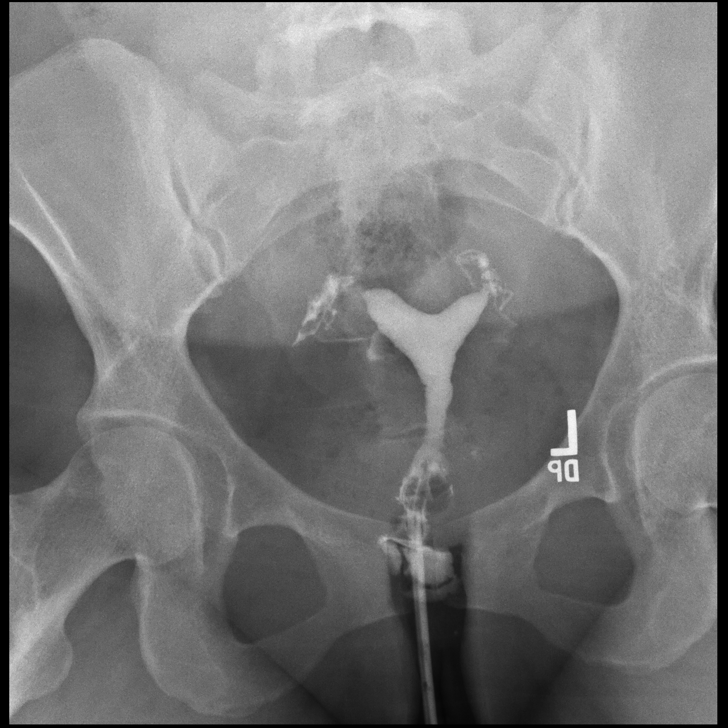
[im 6/8]
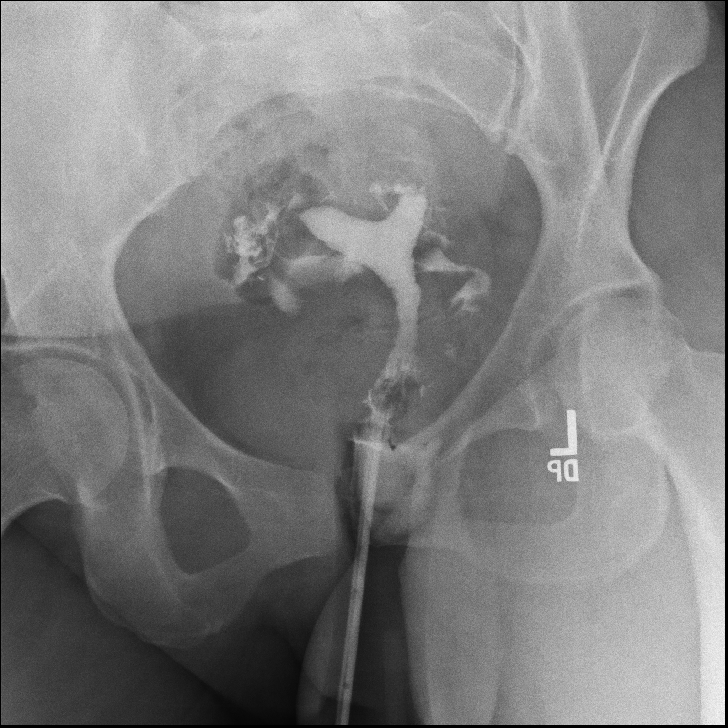
[im 7/8]
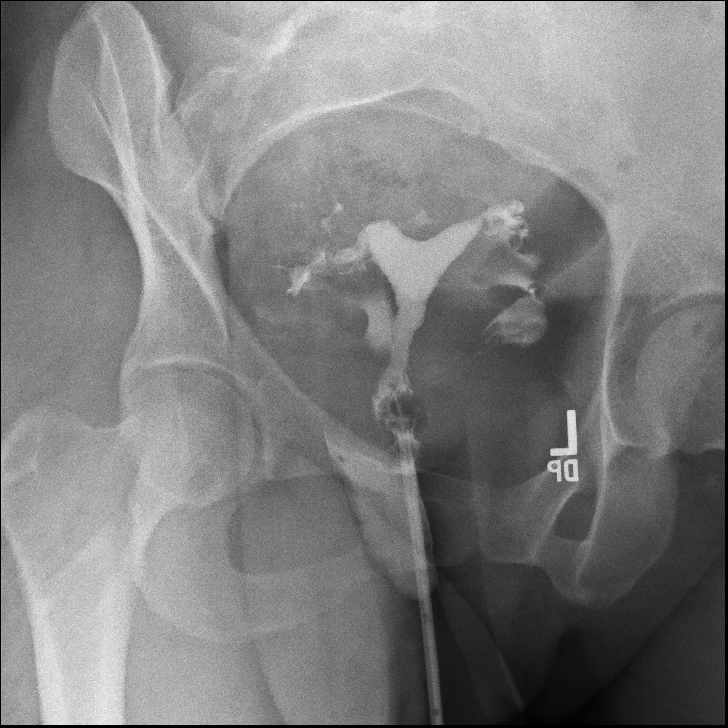
[im 8/8]
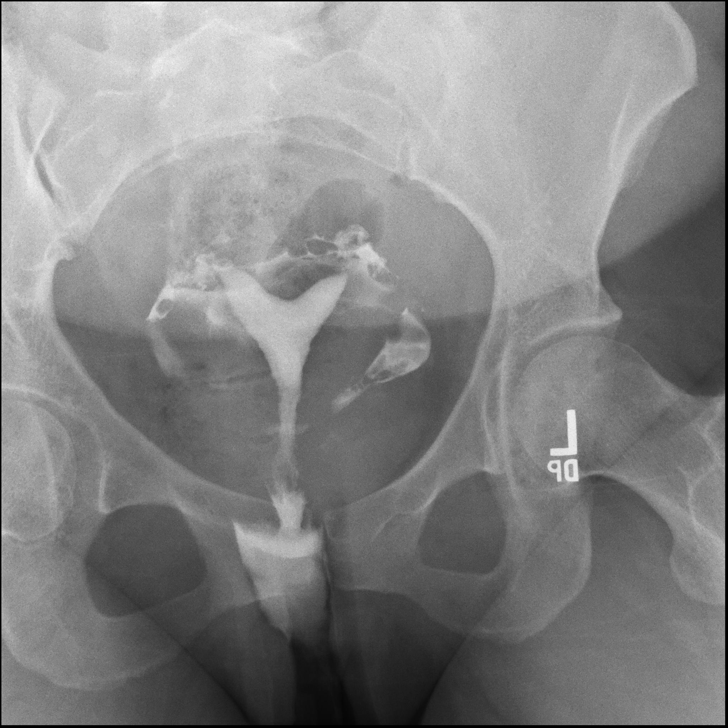

[8 of 8 positions shown; findings below may reference images not displayed]

FLUOROSCOPY:
Radiation Exposure Index (as provided by the fluoroscopic device):
20 mGy Kerma
FINDINGS: Smooth concavity of the fundal uterine cavity contour. Otherwise
normal uterine cavity contour with no uterine cavity filling
defects.

Prompt opacification of the normal caliber and normal appearing
fallopian tubes bilaterally with normal spillage of contrast from
the fimbriated ends of both fallopian tubes and normal dispersal of
contrast within the peritoneal cavity bilaterally.
IMPRESSION: 1. Patent normal fallopian tubes bilaterally.
2. Smooth concavity of the fundal uterine cavity contour,
nonspecific, differential includes arcuate configuration of the
uterus versus less likely a fundal uterine fibroid. Suggest
transvaginal pelvic ultrasound correlation.

## 2024-01-27 IMAGING — DX DG CHEST 2V
2 series · 2 of 2 positions shown · non-contrast
Comparison: 12/20/2010

CLINICAL DATA: Short of breath and cough

EXAM:
CHEST - 2 VIEW

[chest pa]
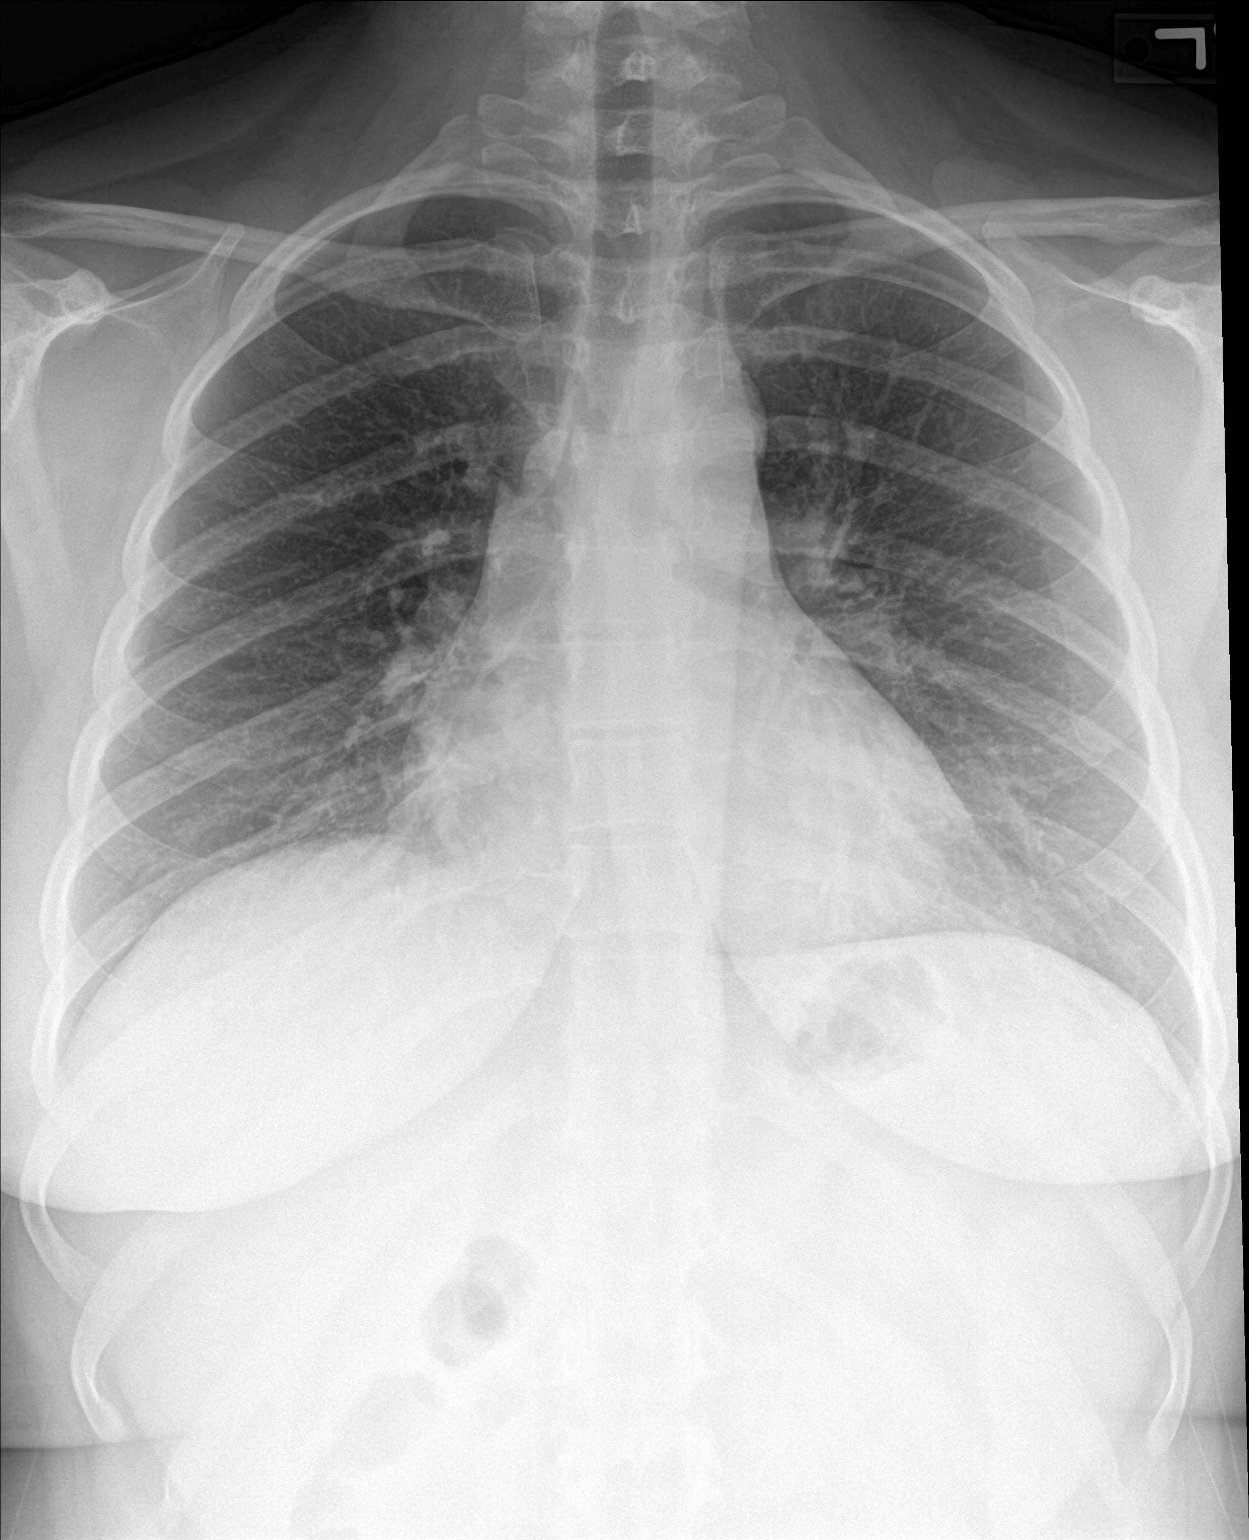

[chest lat]
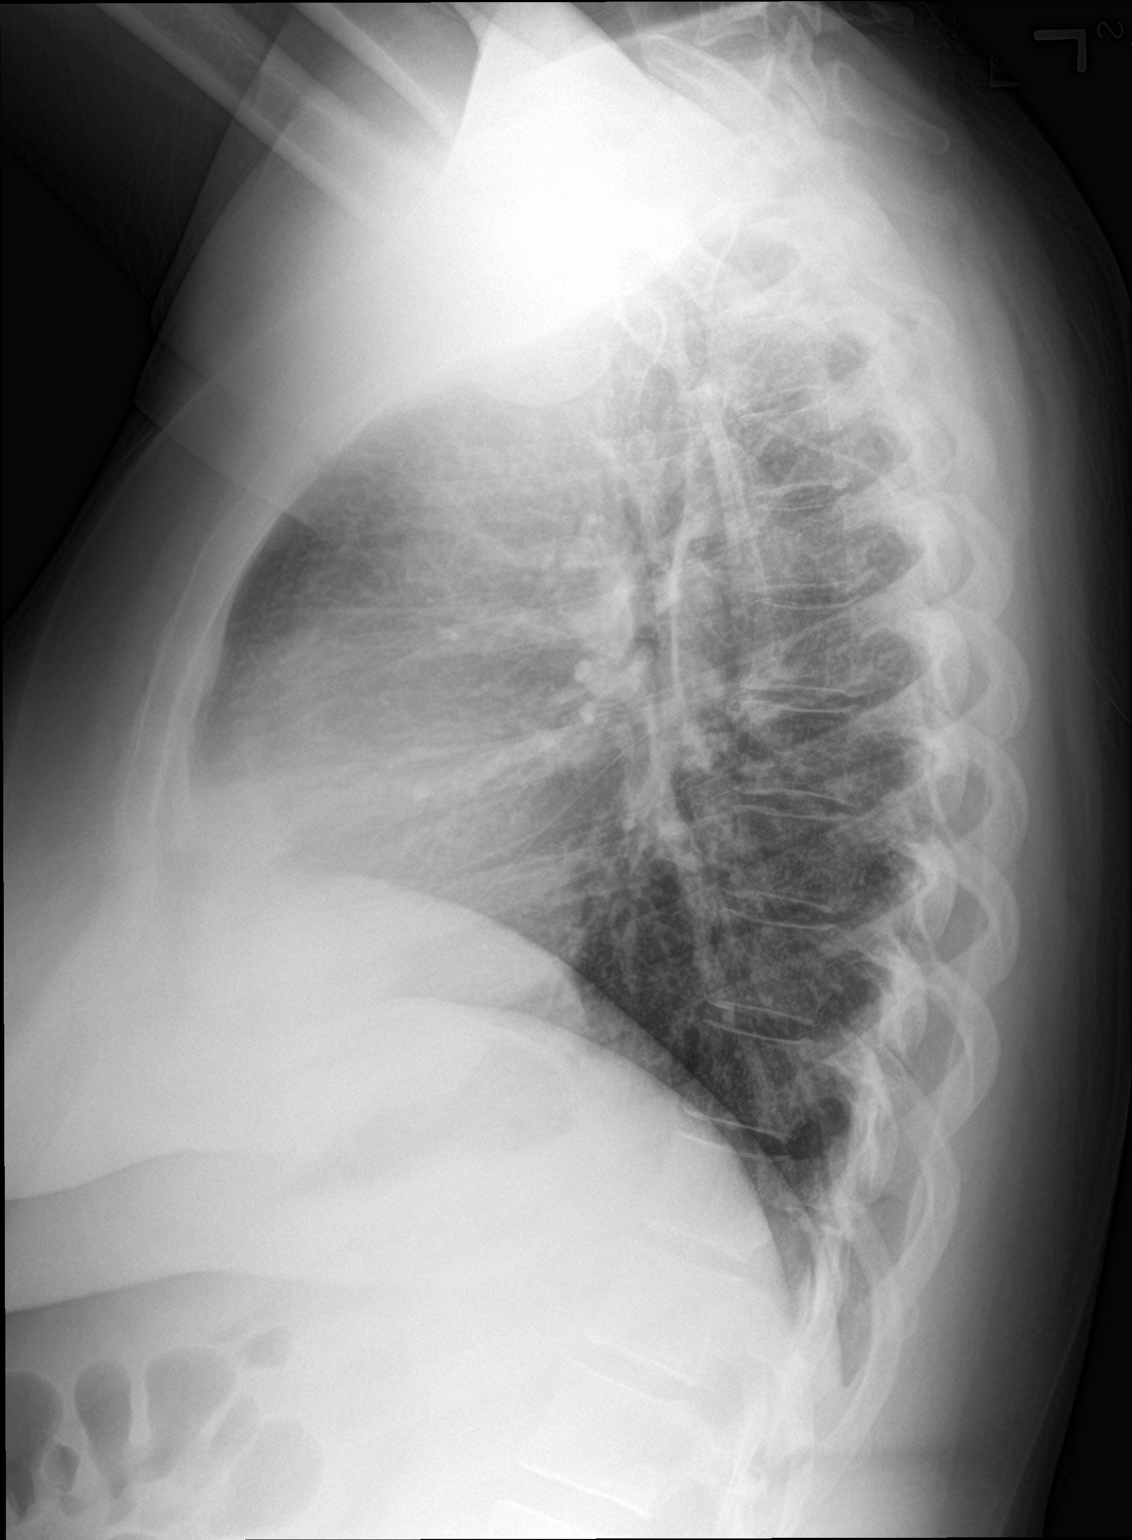

[2 of 2 positions shown; findings below may reference images not displayed]

FINDINGS: The heart size and mediastinal contours are within normal limits.
Both lungs are clear. The visualized skeletal structures are
unremarkable.
IMPRESSION: No active cardiopulmonary disease.
